# Patient Record
Sex: Male | Born: 1960 | Race: White | Hispanic: No | Marital: Married | Smoking: Former smoker
Health system: Southern US, Community
[De-identification: ages and names within clinical notes are randomized; demographics above are authoritative.]

## PROBLEM LIST (undated history)

## (undated) DIAGNOSIS — D126 Benign neoplasm of colon, unspecified: Secondary | ICD-10-CM

## (undated) DIAGNOSIS — D509 Iron deficiency anemia, unspecified: Secondary | ICD-10-CM

## (undated) DIAGNOSIS — M1990 Unspecified osteoarthritis, unspecified site: Secondary | ICD-10-CM

## (undated) DIAGNOSIS — R7611 Nonspecific reaction to tuberculin skin test without active tuberculosis: Secondary | ICD-10-CM

## (undated) HISTORY — DX: Benign neoplasm of colon, unspecified: D12.6

## (undated) HISTORY — PX: OTHER SURGICAL HISTORY: SHX169

## (undated) HISTORY — PX: COLONOSCOPY: SHX174

## (undated) HISTORY — DX: Nonspecific reaction to tuberculin skin test without active tuberculosis: R76.11

## (undated) HISTORY — DX: Unspecified osteoarthritis, unspecified site: M19.90

## (undated) HISTORY — PX: KNEE ARTHROSCOPY: SUR90

## (undated) HISTORY — DX: Iron deficiency anemia, unspecified: D50.9

---

## 1999-11-11 ENCOUNTER — Encounter: Payer: Self-pay | Admitting: Orthopedic Surgery

## 1999-11-11 ENCOUNTER — Ambulatory Visit (HOSPITAL_COMMUNITY): Admission: RE | Admit: 1999-11-11 | Discharge: 1999-11-11 | Payer: Self-pay | Admitting: Orthopedic Surgery

## 2002-07-05 ENCOUNTER — Emergency Department (HOSPITAL_COMMUNITY): Admission: EM | Admit: 2002-07-05 | Discharge: 2002-07-05 | Payer: Self-pay | Admitting: Emergency Medicine

## 2002-07-05 ENCOUNTER — Encounter: Payer: Self-pay | Admitting: Emergency Medicine

## 2002-07-06 ENCOUNTER — Emergency Department (HOSPITAL_COMMUNITY): Admission: EM | Admit: 2002-07-06 | Discharge: 2002-07-06 | Payer: Self-pay

## 2002-07-07 ENCOUNTER — Emergency Department (HOSPITAL_COMMUNITY): Admission: EM | Admit: 2002-07-07 | Discharge: 2002-07-07 | Payer: Self-pay

## 2002-07-12 ENCOUNTER — Emergency Department (HOSPITAL_COMMUNITY): Admission: EM | Admit: 2002-07-12 | Discharge: 2002-07-12 | Payer: Self-pay | Admitting: Emergency Medicine

## 2002-07-19 ENCOUNTER — Emergency Department (HOSPITAL_COMMUNITY): Admission: EM | Admit: 2002-07-19 | Discharge: 2002-07-19 | Payer: Self-pay | Admitting: *Deleted

## 2005-02-11 ENCOUNTER — Encounter (INDEPENDENT_AMBULATORY_CARE_PROVIDER_SITE_OTHER): Payer: Self-pay | Admitting: *Deleted

## 2005-02-11 ENCOUNTER — Ambulatory Visit (HOSPITAL_COMMUNITY): Admission: RE | Admit: 2005-02-11 | Discharge: 2005-02-11 | Payer: Self-pay | Admitting: Gastroenterology

## 2005-10-23 ENCOUNTER — Encounter (HOSPITAL_COMMUNITY): Admission: RE | Admit: 2005-10-23 | Discharge: 2006-01-21 | Payer: Self-pay | Admitting: Gastroenterology

## 2006-02-05 ENCOUNTER — Encounter (HOSPITAL_COMMUNITY): Admission: RE | Admit: 2006-02-05 | Discharge: 2006-05-06 | Payer: Self-pay | Admitting: Gastroenterology

## 2006-08-28 ENCOUNTER — Inpatient Hospital Stay (HOSPITAL_COMMUNITY): Admission: AD | Admit: 2006-08-28 | Discharge: 2006-09-03 | Payer: Self-pay | Admitting: Gastroenterology

## 2006-08-28 ENCOUNTER — Encounter (INDEPENDENT_AMBULATORY_CARE_PROVIDER_SITE_OTHER): Payer: Self-pay | Admitting: *Deleted

## 2007-04-20 ENCOUNTER — Encounter: Payer: Self-pay | Admitting: Internal Medicine

## 2007-05-03 ENCOUNTER — Inpatient Hospital Stay (HOSPITAL_COMMUNITY): Admission: EM | Admit: 2007-05-03 | Discharge: 2007-05-04 | Payer: Self-pay | Admitting: Emergency Medicine

## 2007-05-31 ENCOUNTER — Encounter: Payer: Self-pay | Admitting: Internal Medicine

## 2007-08-25 ENCOUNTER — Encounter: Payer: Self-pay | Admitting: Internal Medicine

## 2007-11-09 ENCOUNTER — Encounter: Payer: Self-pay | Admitting: Internal Medicine

## 2008-03-07 ENCOUNTER — Encounter: Payer: Self-pay | Admitting: Internal Medicine

## 2008-04-12 ENCOUNTER — Encounter: Payer: Self-pay | Admitting: Internal Medicine

## 2008-04-18 ENCOUNTER — Encounter: Payer: Self-pay | Admitting: Internal Medicine

## 2008-04-27 ENCOUNTER — Encounter: Admission: RE | Admit: 2008-04-27 | Discharge: 2008-04-27 | Payer: Self-pay | Admitting: Gastroenterology

## 2008-04-27 ENCOUNTER — Encounter: Payer: Self-pay | Admitting: Internal Medicine

## 2008-05-12 ENCOUNTER — Encounter: Payer: Self-pay | Admitting: Internal Medicine

## 2008-05-19 ENCOUNTER — Encounter: Payer: Self-pay | Admitting: Internal Medicine

## 2008-06-14 ENCOUNTER — Encounter: Payer: Self-pay | Admitting: Internal Medicine

## 2008-07-13 ENCOUNTER — Encounter: Payer: Self-pay | Admitting: Internal Medicine

## 2008-09-13 ENCOUNTER — Encounter: Payer: Self-pay | Admitting: Internal Medicine

## 2008-11-24 ENCOUNTER — Encounter: Payer: Self-pay | Admitting: Internal Medicine

## 2009-01-22 ENCOUNTER — Encounter: Payer: Self-pay | Admitting: Internal Medicine

## 2009-02-06 ENCOUNTER — Encounter: Payer: Self-pay | Admitting: Internal Medicine

## 2009-04-25 ENCOUNTER — Encounter: Payer: Self-pay | Admitting: Internal Medicine

## 2009-09-17 ENCOUNTER — Encounter: Payer: Self-pay | Admitting: Internal Medicine

## 2010-01-03 ENCOUNTER — Encounter: Payer: Self-pay | Admitting: Internal Medicine

## 2010-03-13 ENCOUNTER — Telehealth: Payer: Self-pay | Admitting: Internal Medicine

## 2010-04-03 ENCOUNTER — Encounter (INDEPENDENT_AMBULATORY_CARE_PROVIDER_SITE_OTHER): Payer: Self-pay | Admitting: *Deleted

## 2010-04-03 ENCOUNTER — Telehealth: Payer: Self-pay | Admitting: Internal Medicine

## 2010-04-11 DIAGNOSIS — D509 Iron deficiency anemia, unspecified: Secondary | ICD-10-CM | POA: Insufficient documentation

## 2010-04-11 DIAGNOSIS — K5190 Ulcerative colitis, unspecified, without complications: Secondary | ICD-10-CM | POA: Insufficient documentation

## 2010-04-18 ENCOUNTER — Ambulatory Visit: Payer: Self-pay | Admitting: Internal Medicine

## 2010-04-19 ENCOUNTER — Telehealth: Payer: Self-pay | Admitting: Internal Medicine

## 2010-04-19 LAB — CONVERTED CEMR LAB
Alkaline Phosphatase: 75 units/L (ref 39–117)
BUN: 15 mg/dL (ref 6–23)
Basophils Absolute: 0 10*3/uL (ref 0.0–0.1)
Calcium: 8.9 mg/dL (ref 8.4–10.5)
Chloride: 103 meq/L (ref 96–112)
Eosinophils Absolute: 0 10*3/uL (ref 0.0–0.7)
GFR calc non Af Amer: 102.73 mL/min (ref >60)
Lymphocytes Relative: 5.4 % — ABNORMAL LOW (ref 12.0–46.0)
MCHC: 32.2 g/dL (ref 30.0–36.0)
Neutro Abs: 12.9 10*3/uL — ABNORMAL HIGH (ref 1.4–7.7)
Neutrophils Relative %: 89.4 % — ABNORMAL HIGH (ref 43.0–77.0)
Platelets: 586 10*3/uL — ABNORMAL HIGH (ref 150.0–400.0)
RDW: 17.4 % — ABNORMAL HIGH (ref 11.5–14.6)
Saturation Ratios: 5.4 % — ABNORMAL LOW (ref 20.0–50.0)
Sodium: 140 meq/L (ref 135–145)
Total Protein: 7 g/dL (ref 6.0–8.3)
Transferrin: 264.5 mg/dL (ref 212.0–360.0)

## 2010-04-23 ENCOUNTER — Encounter: Payer: Self-pay | Admitting: Internal Medicine

## 2010-05-15 ENCOUNTER — Telehealth: Payer: Self-pay | Admitting: Internal Medicine

## 2010-06-21 ENCOUNTER — Encounter: Payer: Self-pay | Admitting: Internal Medicine

## 2010-07-18 ENCOUNTER — Encounter: Payer: Self-pay | Admitting: Internal Medicine

## 2010-07-19 ENCOUNTER — Telehealth: Payer: Self-pay | Admitting: Internal Medicine

## 2010-08-02 ENCOUNTER — Ambulatory Visit: Payer: Self-pay | Admitting: Internal Medicine

## 2010-08-05 LAB — CONVERTED CEMR LAB
Basophils Relative: 0.3 % (ref 0.0–3.0)
Hemoglobin: 12.5 g/dL — ABNORMAL LOW (ref 13.0–17.0)
Lymphocytes Relative: 5.4 % — ABNORMAL LOW (ref 12.0–46.0)
MCHC: 32.5 g/dL (ref 30.0–36.0)
Monocytes Relative: 8.3 % (ref 3.0–12.0)
Neutro Abs: 12.9 10*3/uL — ABNORMAL HIGH (ref 1.4–7.7)
RBC: 4.64 M/uL (ref 4.22–5.81)

## 2010-08-15 ENCOUNTER — Telehealth: Payer: Self-pay | Admitting: Internal Medicine

## 2010-08-27 ENCOUNTER — Ambulatory Visit: Payer: Self-pay | Admitting: Internal Medicine

## 2010-08-28 ENCOUNTER — Encounter (INDEPENDENT_AMBULATORY_CARE_PROVIDER_SITE_OTHER): Payer: Self-pay | Admitting: *Deleted

## 2010-09-03 ENCOUNTER — Telehealth: Payer: Self-pay | Admitting: Internal Medicine

## 2010-10-11 ENCOUNTER — Telehealth: Payer: Self-pay | Admitting: Internal Medicine

## 2010-10-12 ENCOUNTER — Encounter: Payer: Self-pay | Admitting: Internal Medicine

## 2010-10-14 ENCOUNTER — Encounter: Payer: Self-pay | Admitting: Internal Medicine

## 2010-10-17 ENCOUNTER — Telehealth: Payer: Self-pay | Admitting: Internal Medicine

## 2010-10-17 ENCOUNTER — Ambulatory Visit: Payer: Self-pay | Admitting: Internal Medicine

## 2010-11-13 ENCOUNTER — Telehealth: Payer: Self-pay | Admitting: Internal Medicine

## 2010-11-20 ENCOUNTER — Encounter: Payer: Self-pay | Admitting: Internal Medicine

## 2010-11-29 ENCOUNTER — Telehealth: Payer: Self-pay | Admitting: Internal Medicine

## 2011-01-16 NOTE — Letter (Signed)
Summary: [facility]   Imported By: Bubba Hales 04/16/2010 12:08:38  _____________________________________________________________________  External Attachment:    Type:   Image     Comment:   External Document

## 2011-01-16 NOTE — Progress Notes (Signed)
Summary: Medication  Phone Note Call from Patient Call back at [phone]   Caller: Patient Call For: Dr. Olevia Perches Reason for Call: Talk to Nurse Summary of Call: Pt wants to disucss the appt he needs to make for a colon, he also has questions about his [name] Initial call taken by: Martinique Johnson,  November 13, 2010 9:42 AM  Follow-up for Phone Call        I have left a message for the patient to call back. Dottie Nelson-Smith CMA Deborra Medina)  November 13, 2010 3:17 PM   Spoke with patient. Apparently there has been a mixup regarding [name] prescription and he has no refills. I have called and spoken to Raquel Sarna at [facility] and they will send out prescription tommorrow with priority shipping. Per Dr Nichola Sizer last note, patient was to schedule colonoscopy before the end of the year. Per patient, his insurance deductible started over in November. He is waiting to hear back about some insurance conflicts before he schedules a colonoscopy. He states that Dr Olevia Perches already knows about this. Follow-up by: Madlyn Frankel CMA Deborra Medina),  November 13, 2010 4:39 PM    New/Updated Medications: [name] 40 MG/0.8ML KIT (ADALIMUMAB) Inject 61m Subcutaneously weekly. [name] 40 MG/0.8ML KIT (ADALIMUMAB) Inject 49mSubcutaneously weekly. Prescriptions: [name] 40 MG/0.8ML KIT (ADALIMUMAB) Inject 4033mubcutaneously weekly.  #4 pens x 11   Entered by:   DotMadlyn FrankelA (AAMA)   Authorized by:   DorLafayette Dragon   Signed by:   DotJasperAMCoalmontn 11/13/2010   Method used:   Electronically to        [facility]       [address]     [place]  [id number]    Ph: [phone]    Fax: [phone]:  :   [id number] Document: Medication last colonoscopy 04/2009 by Dr BucCristina Gonge can wait till May 2012 ( but not later ) to have his colonoscopy. he has been having colonoscopies every 2-3 years.

## 2011-01-16 NOTE — Procedures (Signed)
Summary: COLON (Dr Kalman Shan)   EGD  Procedure date:  02/11/2005  Findings:      Location: [facility]   NAME:  [name], [name]              ACCOUNT NO.:  [id number]   MEDICAL RECORD NO.:  [id number]          PATIENT TYPE:  AMB   LOCATION:  ENDO                         FACILITY:  [facility]   PHYSICIAN:  Ronald Lobo, M.D.   DATE OF BIRTH:  05-Jul-1961   DATE OF PROCEDURE:  02/11/2005  DATE OF DISCHARGE:                                 OPERATIVE REPORT   PROCEDURE:  Colonoscopy with biopsy.   INDICATION:  A 50 year old gentleman with a longstanding history of  ulcerative colitis, with recent exacerbation requiring high doses of  prednisone to try to control symptoms.   FINDINGS:  Severe colitis extending almost to the cecum.   PROCEDURE:  The nature, purpose, and risks of the procedure had been  discussed with the patient who provided written consent. Sedation was  fentanyl 50 mcg and Versed 5 milligrams plus Phenergan 12.5 milligrams IV  without arrhythmias or desaturation. The patient had prolapsed internal  hemorrhoids without obvious thrombosis but they were succulent and engorged.  I did not get a good feel of the prostate gland due to rectal discomfort on  examination related to the hemorrhoids.   The Olympus adjustable tension pediatric video colonoscope was easily  advanced around the colon to the cecum as identified by clear visualization  of the appendiceal orifice. I attempted to enter the terminal ileum but was  not able to do so, although there was no evidence of any ulceration, frank  stenosis or anatomic abnormalities in the cecum.   This procedure was done unprepped but, despite that, there was essentially  no stool throughout the colon, just a little bit of mucoid and slightly  solid stool residue in the cecum.   This exam was remarkable for the presence of confluent pancolitis up to the  level of the cecum or proximal ascending colon. That colitis  was  characterized by complete loss of vascularity, intense erythema,  granularity, and areas of exudate and pinpoint hemorrhages. There was a  fairly sharp transition zone just above the cecum so that the proximal 5-10  cm of the colon had normal appearing mucosa and biopsies were obtained from  that area separately as well as randomly from around the colon.  Overall, I  would rate the intensity of the colitis as approximately seven or eight  scale of 10.   No polyps, masses or diverticulosis were noted. I did not retroflex in the  rectum.   The patient tolerated the procedure well and there no apparent  complications.   IMPRESSION:  Active, severe nearly pancolitis consistent with his underlying  diagnosis of ulcerative colitis.   PLAN:  Await pathology results. Consider intensification medical therapy.      RB/MEDQ  D:  02/11/2005  T:  02/11/2005  Job:  [id number]    FINAL DIAGNOSIS    ***MICROSCOPIC EXAMINATION AND DIAGNOSIS***    1. COLON, CECUM, BIOPSY: NO ACTIVE COLITIS OR DYSPLASIA   IDENTIFIED.    2. COLON, RANDOM  BIOPSIES: SEVERE, ACTIVE MUCOSAL COLITIS WITH   FEATURES OF INFLAMMATORY BOWEL DISEASE, FAVOR ULCERATIVE COLITIS,   SEE COMMENT.    COMMENT   1. There is colorectal mucosa with normal crypt architecture and   no objective increase in inflammation. No active inflammation,   microscopic colitis, collagenous colitis or significant chronic   changes identified. No hyperplastic or adenomatous changes are   seen, and there is no evidence of malignancy.    2. There are fragments of benign colonic mucosa in which there   is severe inflammation within the lamina propria associated with   cryptitis, crypt abscesses, architectural distortion, and basal   plasmacytosis. The surface of the mucosa is partially eroded.   No granulomatous inflammation is seen. Some of the glands show   goblet cell dropout and florid benign reactive/reparative   changes. There is no  evidence of malignancy. In one fragment   there is inflammation that appears to extend into a small amount   of submucosa in the biopsy. All the fragments in this specimen   are involved with the colitis.    In Part 2 the findings are those of a severe chronic active   mucosal colitis that displays features of an inflammatory bowel   disease. Although there is focal inflammation in a small amount   of submucosa present, all the tissue fragments in Part 2 are   involved with the process and this would favor ulcerative   colitis. Therefore, clinical and endoscopic correlation is   strongly recommended. (JAS:caf 02/12/05)    cf   Date Reported: 02/12/2005 Neldon Mc, MD   *** Electronically Signed Out By JAS ***    Clinical information   Colitis (cm)    specimen(s) obtained   1: Colon, biopsy, cecal   2: Colon, biopsy, random    Gross Description   1. Received in formalin are tan, soft tissue fragments that are   submitted in toto. Number: Multiple pieces   Size: 0.2 to 0.4 cm    2. Received in formalin are tan, soft tissue fragments that are   submitted in toto. Number: Multiple pieces   Size: 0.2 to 0.3 cm (GRP:glk 02-11-05)    gk/

## 2011-01-16 NOTE — Letter (Signed)
Summary: [facility]   Imported By: Bubba Hales 04/16/2010 12:41:24  _____________________________________________________________________  External Attachment:    Type:   Image     Comment:   External Document

## 2011-01-16 NOTE — Letter (Signed)
Summary: [facility]  [facility]   Imported By: Bubba Hales 04/16/2010 12:06:49  _____________________________________________________________________  External Attachment:    Type:   Image     Comment:   External Document

## 2011-01-16 NOTE — Miscellaneous (Signed)
  Clinical Lists Changes  Medications: Rx of DICYCLOMINE HCL 20 MG TABS (DICYCLOMINE HCL) Take one by mouth twice daily as needed.;  #60 x 0;  Signed;  Entered by: Madlyn Frankel CMA (AAMA);  Authorized by: Lafayette Dragon MD;  Method used: Electronically to [facility]. #[id number]*, [address] [place], [place], [place]  [address], Ph: [phone], Fax: [phone]    Prescriptions: DICYCLOMINE HCL 20 MG TABS (DICYCLOMINE HCL) Take one by mouth twice daily as needed.  #60 x 0   Entered by:   Madlyn Frankel CMA (AAMA)   Authorized by:   Lafayette Dragon MD   Signed by:   [place] ([place]) on 07/18/2010   Method used:   Electronically to        [facility]. [phone]* (retail)       [address]       [place], [place]  [address]       Ph: [phone]       Fax: [phone]   RxID:   [id number]

## 2011-01-16 NOTE — Letter (Signed)
Summary: [facility]  [facility]   Imported By: Bubba Hales 04/16/2010 11:58:06  _____________________________________________________________________  External Attachment:    Type:   Image     Comment:   External Document

## 2011-01-16 NOTE — Progress Notes (Signed)
Summary: Follow Up Needed??  ---- Converted from flag ----  ---- 07/18/2010 9:39 AM, Carla Drape Nelson-Smith CMA (AAMA) wrote: I have spoken to patient to advise him that he needs an office visit however, he says that he did not know he needed a visit...Marland KitchenMarland KitchenIn your May office note, you wanted him to have an 8 week follow up....Marland KitchenMarland KitchenHe says that you and he spoke and he asked if he needed an office visit but was told he did not....Marland KitchenMarland KitchenI have gone ahead and scheduled him for a follow up appointment 08-02-10 and told him I would check with you to make certain he needed to keep the appointment as I was unaware he had a conversation with you about this previously. He has also been given a refill on methotrexate until I get your reply. He states that he is doing well, no rectal bleeding and is down to 5 mg prednisone. ------------------------------  ---- 07/18/2010 10:18 PM, Lafayette Dragon MD wrote: He should follow up just to see where he stands with his steroid taper.  Phone Note Outgoing Call   Call placed by: Madlyn Frankel CMA Deborra Medina),  July 19, 2010 8:29 AM Call placed to: Patient Summary of Call: I have advised patient that he will need to keep his follow up appointment for 08/02/10. Patient verbalizes understanding. Initial call taken by: Madlyn Frankel CMA (AAMA),  July 19, 2010 8:30 AM

## 2011-01-16 NOTE — Letter (Signed)
Summary: [facility]  [facility]   Imported By: Bubba Hales 04/16/2010 12:42:57  _____________________________________________________________________  External Attachment:    Type:   Image     Comment:   External Document

## 2011-01-16 NOTE — Letter (Signed)
Summary: [facility]  [facility]   Imported By: Bubba Hales 04/16/2010 11:55:13  _____________________________________________________________________  External Attachment:    Type:   Image     Comment:   External Document

## 2011-01-16 NOTE — Letter (Signed)
Summary: [facility]  [facility]   Imported By: Bubba Hales 04/16/2010 12:03:22  _____________________________________________________________________  External Attachment:    Type:   Image     Comment:   External Document

## 2011-01-16 NOTE — Assessment & Plan Note (Signed)
Summary: 3 month f/u colitis/dns   History of Present Illness Visit Type: Follow-up Visit Primary GI MD: [name] MD Primary Provider: Tami Lin, MD Chief Complaint: ulcerative colitis- no flare ups History of Present Illness:   This is a 50 year old white male with ulcerative colitis since 1995 who has been followed by me since May 2011. He has  been in a a chronic flareup of pancolitis. He is finally going into remission having 4-5 bowel movements a day, usually one or 2 at night. He occasionally has a small amount of blood per rectum. His medications include methotrexate 15 mg weekly, [name] 40 mg weekly and a prednisone taper down to 3 mg daily going down by 1 mg every month. He takes Bentyl 20 mg twice a day. He was on iron supplements. He is due for a colonoscopy at this time. He had positive IBD markers. He has no specific complaints today and his level of energy has been good.   GI Review of Systems      Denies abdominal pain, acid reflux, belching, bloating, chest pain, dysphagia with liquids, dysphagia with solids, heartburn, loss of appetite, nausea, vomiting, vomiting blood, weight loss, and  weight gain.        Denies anal fissure, black tarry stools, change in bowel habit, constipation, diarrhea, diverticulosis, fecal incontinence, heme positive stool, hemorrhoids, irritable bowel syndrome, jaundice, light color stool, liver problems, rectal bleeding, and  rectal pain.    Current Medications (verified): 1)  Prednisone 1 Mg Tabs (Prednisone) .... Take As Directed 2)  [name] 40 Mg/0.59m Kit (Adalimumab) .... 461mSubcutaneously Weekly. 3)  Methotrexate 2.5 Mg Tabs (Methotrexate Sodium) .... Take 6 Tablets By Mouth Every Friday 4)  Entocort Ec 3 Mg Xr24h-Cap (Budesonide) .... Take 3 Capsules By Mouth Every Morning 5)  Ferrous Sulfate 325 (65 Fe) Mg Tabs (Ferrous Sulfate) .... Take One By Mouth 3 Times Daily, With Meals. 6)  Dicyclomine Hcl 20 Mg Tabs (Dicyclomine Hcl)  .... Take One By Mouth Twice Daily As Needed.  Allergies (verified): No Known Drug Allergies  Past History:  Past Medical History: Reviewed history from 04/18/2010 and no changes required. Current Problems:  ANEMIA, IRON DEFICIENCY (ICD-280.9) ULCERATIVE COLITIS (ICD-556.9)   Arthritis  Past Surgical History: Reviewed history from 04/18/2010 and no changes required. shoulder surgery left ankle surgery  Family History: Reviewed history from 04/11/2010 and no changes required. Family History of Colitis: Mother No FH of Colon Cancer:  Social History: Reviewed history from 04/11/2010 and no changes required. Patient is a former smoker. -stopped in 197121llicit Drug Use - no--did in the remote past Occupation: self employed Alcohol Use - no  Review of Systems  The patient denies allergy/sinus, anemia, anxiety-new, arthritis/joint pain, back pain, blood in urine, breast changes/lumps, change in vision, confusion, cough, coughing up blood, depression-new, fainting, fatigue, fever, headaches-new, hearing problems, heart murmur, heart rhythm changes, itching, menstrual pain, muscle pains/cramps, night sweats, nosebleeds, pregnancy symptoms, shortness of breath, skin rash, sleeping problems, sore throat, swelling of feet/legs, swollen lymph glands, thirst - excessive , urination - excessive , urination changes/pain, urine leakage, vision changes, and voice change.         Pertinent positive and negative review of systems were noted in the above HPI. All other ROS was otherwise negative.   Vital Signs:  Patient profile:   [age]ear old male Height:      68 inches Weight:      174 pounds BMI:     26.55 Pulse  rate:   64 / minute Pulse rhythm:   regular BP sitting:   100 / 68  (left arm) Cuff size:   regular  Vitals Entered By: June McMurray Kimble Deborra Medina) (October 17, 2010 11:00 AM)  Physical Exam  General:  Well developed, well nourished, no acute distress. Eyes:  PERRLA, no  icterus. Mouth:  No deformity or lesions, dentition normal. Neck:  Supple; no masses or thyromegaly. Lungs:  Clear throughout to auscultation. Heart:  Regular rate and rhythm; no murmurs, rubs,  or bruits. Abdomen:  soft nontender abdomen with normoactive bowel sounds. No palpable mass. Extremities:  multiple ecchymoses. Skin:  Intact without significant lesions or rashes. Psych:  Alert and cooperative. Normal mood and affect.   Impression & Recommendations:  Problem # 1:  ULCERATIVE COLITIS (ICD-556.9) Patient has ulcerative pancolitis in partial remission. He is due for a colonoscopy to rule out dysplasia. He needs to continue on his prednisone taper, methotrexate and [name]. We will give him refills on all medications at the appropriate time.   Problem # 2:  ANEMIA, IRON DEFICIENCY (ICD-280.9) Patient needs to continue iron supplements.  Patient Instructions: 1)  Take methotrexate 2.5 mg, take 6 tablets weekly. 2)  Continue [name] 40 mg IM weekly. 3)  Prednisone 30 mg daily x 3 more weeks then 2 mg daily.  4)  You should have a CBC next time you come to the office. 5)  Continue iron supplements. 6)  Dicyclomine 20 mg twice a day. 7)  You will need to have a colonscopy scheduled before the end of the year. Please call us back to schedule this. 8)  Copy sent to :Dr Laney Pastor  9)  The medication list was reviewed and reconciled.  All changed / newly prescribed medications were explained.  A complete medication list was provided to the patient / caregiver.

## 2011-01-16 NOTE — Letter (Signed)
Summary: New Patient letter  [facility]  [address] [place], [place] [address]   Phone: [phone]  Fax: [phone]       04/03/2010 MRN: [id number]  [facility] [place], [place]  [address]  Dear Mr. Knueppel,  Welcome to the Gastroenterology Division at [facility].    You are scheduled to see Dr. [name] on 04-18-10 at 11:30am, on the 3rd floor at [facility] [place] [facility].  We ask that you try to arrive at our office 15 minutes prior to your appointment time to allow for check-in.  We would like you to complete the enclosed self-administered evaluation form prior to your visit and bring it with you on the day of your appointment.  We will review it with you.  Also, please bring a complete list of all your medications or, if you prefer, bring the medication bottles and we will list them.  Please bring your insurance card so that we may make a copy of it.  If your insurance requires a referral to see a specialist, please bring your referral form from your primary care physician.  Co-payments are due at the time of your visit and may be paid by cash, check or credit card.     Your office visit will consist of a consult with your physician (includes a physical exam), any laboratory testing he/she may order, scheduling of any necessary diagnostic testing (e.g. x-ray, ultrasound, CT-scan), and scheduling of a procedure (e.g. Endoscopy, Colonoscopy) if required.  Please allow enough time on your schedule to allow for any/all of these possibilities.    If you cannot keep your appointment, please call [phone] to cancel or reschedule prior to your appointment date.  This allows [place] the opportunity to schedule an appointment for another patient in need of care.  If you do not cancel or reschedule by 5 p.m. the business day prior to your appointment date, you will be charged a $50.00 late cancellation/no-show fee.    Thank you for choosing  [facility] for your medical needs.  We appreciate the opportunity to care for you.  Please visit [place] at our website  to learn more about our practice.                     Sincerely,                                                             The Gastroenterology Division   Appended Document: New Patient letter Letter mailed to patient.

## 2011-01-16 NOTE — Medication Information (Signed)
Summary: Approval for [name]/[employer]  Approval for [name]/[employer]   Imported By: Edmonia James 10/22/2010 12:52:30  _____________________________________________________________________  External Attachment:    Type:   Image     Comment:   External Document

## 2011-01-16 NOTE — Letter (Signed)
Summary: [facility]  [facility]   Imported By: Bubba Hales 04/16/2010 12:04:44  _____________________________________________________________________  External Attachment:    Type:   Image     Comment:   External Document

## 2011-01-16 NOTE — Miscellaneous (Signed)
Summary: Methotrexate  Clinical Lists Changes  Medications: Changed medication from METHOTREXATE 2.5 MG TABS (METHOTREXATE SODIUM) Take 6 tablets by mouth every friday to METHOTREXATE 2.5 MG TABS (METHOTREXATE SODIUM) Take 6 tablets by mouth every friday (PHARMACY-PLEASE WRITE ON SCRIPT.....MUST HAVE OFFICE VISIT FOR FURTHER REFILLS!) - Signed Rx of METHOTREXATE 2.5 MG TABS (METHOTREXATE SODIUM) Take 6 tablets by mouth every friday (PHARMACY-PLEASE WRITE ON SCRIPT.....MUST HAVE OFFICE VISIT FOR FURTHER REFILLS!);  #24 x 0;  Signed;  Entered by: Madlyn Frankel CMA (AAMA);  Authorized by: Lafayette Dragon MD;  Method used: Electronically to [facility]. #[id number]*, [address] [place], [place], [place]  [address], Ph: [phone], Fax: [phone]    Prescriptions: METHOTREXATE 2.5 MG TABS (METHOTREXATE SODIUM) Take 6 tablets by mouth every friday (PHARMACY-PLEASE WRITE ON SCRIPT.....MUST HAVE OFFICE VISIT FOR FURTHER REFILLS!)  #24 x 0   Entered by:   Madlyn Frankel CMA (AAMA)   Authorized by:   Lafayette Dragon MD   Signed by:   [place] ([place]) on 06/21/2010   Method used:   Electronically to        [facility]. [phone]* (retail)       [address] [place] [address]       [place], [place]  [address]       Ph: [phone]       Fax: [phone]   RxID:   [id number]

## 2011-01-16 NOTE — Letter (Signed)
Summary: [facility]  [facility]   Imported By: Bubba Hales 04/16/2010 12:01:03  _____________________________________________________________________  External Attachment:    Type:   Image     Comment:   External Document

## 2011-01-16 NOTE — Letter (Signed)
Summary: [facility]  [facility]   Imported By: Bubba Hales 04/16/2010 12:10:23  _____________________________________________________________________  External Attachment:    Type:   Image     Comment:   External Document

## 2011-01-16 NOTE — Letter (Signed)
Summary: [facility]  [facility]   Imported By: Bubba Hales 04/16/2010 12:02:16  _____________________________________________________________________  External Attachment:    Type:   Image     Comment:   External Document

## 2011-01-16 NOTE — Progress Notes (Signed)
Summary: discussion  Phone Note Call from Patient Call back at [phone]   Caller: Patient Call For: Dr. Olevia Perches Reason for Call: Talk to Nurse Summary of Call: would like to speak with Dottie about "medication" and his "health"... pt would not give any further detail Initial call taken by: Lucien Mons,  September 03, 2010 2:35 PM  Follow-up for Phone Call        I have left a message for the patient to call back.Dottie Nelson-Smith CMA Deborra Medina)  September 03, 2010 3:33 PM   Patient requests his prescriptions be sent in today. I have advised him that this was completed already and I have called the pharmacy to make certain that the medication is available to him. Dottie Nelson-Smith CMA Deborra Medina)  September 03, 2010 4:01 PM

## 2011-01-16 NOTE — Progress Notes (Signed)
Summary: Colon Questions  Phone Note Call from Patient Call back at [phone]   Caller: Patient Call For: Dr. Santiago Bumpers Reason for Call: Talk to Nurse Summary of Call: Made Pt aware of notes in EMR but he still have concerns that he only wants to discuss with Dottie because that is who he has been speak with  Initial call taken by: Martinique Johnson,  November 29, 2010 10:35 AM  Follow-up for Phone Call        lm for patient to call back. Dottie Nelson-Smith CMA Deborra Medina)  November 29, 2010 12:48 PM   I have left a message for the patient to call back. Dottie Nelson-Smith CMA Deborra Medina)  December 02, 2010 2:03 PM   Patient would like [place] to know that he will be having procedure (colon) in March 2012 weather and money permitting. Advised him that this is fine as long as he has procedure by May 2012 per Dr Olevia Perches. Dottie Nelson-Smith CMA Deborra Medina)  December 02, 2010 3:08 PM

## 2011-01-16 NOTE — Assessment & Plan Note (Signed)
Summary: f/u crohns/dn   History of Present Illness Visit Type: Follow-up Visit Primary GI MD: [name] MD Primary Kalasia Crafton: Tami Lin, MD Chief Complaint: Patient here to follow up for his crohns he states that he is doing great and having no problems.  History of Present Illness:   This is a 50 year old white male with ulcerative colitis since 1995. His first visit with [place] was on 04/18/10. His last colonoscopy in May 2006 showed pancolitis. He has had chronic diarrhea which has improved greatly on Bentyl 20 mg p.r.n. His last hemoglobin was 1.3, hematocrit was 35.1 with an albumin of 3.4 and a sedimentation rate of 40. His iron saturation was 5.4%. Overall, he is doing reasonably well using probiotics, [name] 40 mg weekly, Entocort 9 mg daily, methotrexate 15 mg weekly and a prednisone taper. He is currently at 5 mg a day. 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   GI Review of Systems      Denies abdominal pain, acid reflux, belching, bloating, chest pain, dysphagia with liquids, dysphagia with solids, heartburn, loss of appetite, nausea, vomiting, vomiting blood, weight loss, and  weight gain.        Denies anal fissure, black tarry stools, change in bowel habit, constipation, diarrhea, diverticulosis, fecal incontinence, heme positive stool, hemorrhoids, irritable bowel syndrome, jaundice, light color stool, liver problems, rectal bleeding, and  rectal pain.    Current Medications (verified): 1)  Prednisone 5 Mg Tabs (Prednisone) .... Take 1 Tablet Daily 2)  [name] 40 Mg/0.71m Kit (Adalimumab) .... 448mSubcutaneously Weekly. 3)  Methotrexate 2.5 Mg Tabs (Methotrexate Sodium) .... Take 6 Tablets By Mouth Every Friday 4)  Entocort Ec 3 Mg Xr24h-Cap (Budesonide) .... Take 3 Capsules By Mouth Every Morning 5)  Ferrous Sulfate 325 (65 Fe) Mg Tabs (Ferrous Sulfate) .... Take One By Mouth 3 Times Daily, With Meals. 6)  Dicyclomine Hcl 20 Mg Tabs  (Dicyclomine Hcl) .... Take One By Mouth Twice Daily As Needed.  Allergies (verified): No Known Drug Allergies  Past History:  Past Medical History: Reviewed history from 04/18/2010 and no changes required. Current Problems:  ANEMIA, IRON DEFICIENCY (ICD-280.9) ULCERATIVE COLITIS (ICD-556.9)   Arthritis  Past Surgical History: Reviewed history from 04/18/2010 and no changes required. shoulder surgery left ankle surgery  Family History: Reviewed history from 04/11/2010 and no changes required. Family History of Colitis: Mother No FH of Colon Cancer:  Social History: Reviewed history from 04/11/2010 and no changes required. Patient is a former smoker. -stopped in 198916llicit Drug Use - no--did in the remote past Occupation: self employed Alcohol Use - no  Review of Systems  The patient denies allergy/sinus, anemia, anxiety-new, arthritis/joint pain, back pain, blood in urine, breast changes/lumps, change in vision, confusion, cough, coughing up blood, depression-new, fainting, fatigue, fever, headaches-new, hearing problems, heart murmur, heart rhythm changes, itching, menstrual pain, muscle pains/cramps, night sweats, nosebleeds, pregnancy symptoms, shortness of breath, skin rash, sleeping problems, sore throat, swelling of feet/legs, swollen lymph glands, thirst - excessive , urination - excessive , urination changes/pain, urine leakage, vision changes, and voice change.         Pertinent positive and negative review of systems were noted in the above HPI. All other ROS was otherwise negative.   Vital Signs:  Patient profile:   [age]ear old male Height:      68 inches Weight:      172.8 pounds BMI:     26.37 Pulse rate:   76 /  minute Pulse rhythm:   regular BP sitting:   112 / 66  (left arm) Cuff size:   regular  Vitals Entered By: Bernita Buffy CMA Deborra Medina) (August 02, 2010 1:57 PM)  Physical Exam  General:  Alert, oriented, somewhat anxious. Eyes:   Conjunctivitis, especially in the right eye. Mouth:  normal mucosa. Neck:  Supple; no masses or thyromegaly. Lungs:  Clear throughout to auscultation. Heart:  Regular rate and rhythm; no murmurs, rubs,  or bruits. Abdomen:  soft nontender abdomen with normoactive bowel sounds. No tenderness. Rectal:  patient refused. Extremities:  trace edema. Skin:  multiple scratch marks or ecchymoses. Psych:  Alert and cooperative. Normal mood and affect.   Impression & Recommendations:  Problem # 1:  ULCERATIVE COLITIS (ICD-556.9) Patient has stable ulcerative colitis which has symptomatically improved using antispasmodics. He has been able to taper his prednisone down to 5 mg daily. He will continue by 1 mg monthly. I will see him again in 3 months. He is to continue on his Entocort, methotrexate, [name], and prednisone taper. We will check a CBC today. Orders: TLB-CBC Platelet - w/Differential (85025-CBCD)  Problem # 2:  ANEMIA, IRON DEFICIENCY (ICD-280.9) Patient has continued on iron supplements. We will check a CBC today. Orders: TLB-CBC Platelet - w/Differential (85025-CBCD)  Patient Instructions: 1)  Continue taking Entocort 9 mg daily. A new electronic prescription has been sent to the pharmacy for you. 2)  Please go to the lab to have your CBC done before leaving the office today. 3)  Please schedule a follow-up appointment in 3 months. 4)  Copy sent to : Dr Tami Lin 5)  The medication list was reviewed and reconciled.  All changed / newly prescribed medications were explained.  A complete medication list was provided to the patient / caregiver. Prescriptions: ENTOCORT EC 3 MG XR24H-CAP (BUDESONIDE) Take 3 capsules by mouth every morning  #90 x 5   Entered by:   Madlyn Frankel CMA (AAMA)   Authorized by:   Lafayette Dragon MD   Signed by:   Alfred (AAMA) on 08/02/2010   Method used:   Electronically to        [facility]. [phone]* (retail)        [place].       [place], [place]  [address]       Ph: [phone]       Fax: [phone]   RxID:   [id number]

## 2011-01-16 NOTE — Progress Notes (Signed)
Summary: Dr.Kaoir Loree will accept pt.  ---- Converted from flag ---- ---- 04/03/2010 2:34 PM, Lafayette Dragon MD wrote: Hilda Blades, I have talked to the pt for a long time. He gave me couple of reasons for transfer which seem valid. I will take him over. Please call him on his cell phone to arrange for an appointment. Thanx  DB ------------------------------  Phone Note Outgoing Call   Call placed by: Vivia Ewing LPN,  April 04, 9995 3:18 PM Call placed to: Patient Summary of Call: Pt. is scheduled to see Dr.Janeth Terry on 04-18-10 at 11:30am, appt. reviewed with him by phone and NP3 packet mailed to him. Pt. instructed to call back as needed.  Initial call taken by: Vivia Ewing LPN,  April 03, 7226 3:20 PM

## 2011-01-16 NOTE — Letter (Signed)
Summary: Office Visit Letter  [facility]  [address] [place], [place] [address]   Phone: [phone]  Fax: [phone]      August 28, 2010 MRN: [id number]   [facility] [address] [place], [place]  [address]   Dear Mr. Fass,   According to our records, it is time for you to schedule a follow-up office visit with [place] in November.   At your convenience, please call [phone] (option #2)to schedule an office visit. If you have any questions, concerns, or feel that this letter is in error, we would appreciate your call.   Sincerely,  Lowella Bandy. Olevia Perches, M.D.  [facility] [phone]

## 2011-01-16 NOTE — Progress Notes (Signed)
Summary: Talk to nurse before appt today at 10:15am  Phone Note Call from Patient Call back at [phone]   Call For: Dr Olevia Perches Reason for Call: Talk to Nurse Summary of Call: Has an appoinment today at 10:15am and wants to discuss a problem with nurse before coming in. Initial call taken by: Irwin Brakeman [facility],  October 17, 2010 8:58 AM  Follow-up for Phone Call        Patient states he is coming for his appointment but it may be 10:30 before he gets here. Wanted to let Dottie know. Follow-up by: Leone Payor RN,  October 17, 2010 9:17 AM

## 2011-01-16 NOTE — Letter (Signed)
[name]   Imported By: Bubba Hales 11/26/2010 09:00:25  _____________________________________________________________________  External Attachment:    Type:   Image     Comment:   External Document

## 2011-01-16 NOTE — Progress Notes (Signed)
Summary: Pt. wants to switch to Dr.Shandrell Boda from [place]  Phone Note Call from Patient Call back at [phone]   Caller: Patient Call For: Dr. Olevia Perches Reason for Call: Talk to Nurse Summary of Call: Pt. would like to see Dr. Olevia Perches b/c his mother is a pt.  Would like to discuss b/c he has Ulcerative Colitis.  Initial call taken by: Webb Laws,  March 13, 2010 9:29 AM  Follow-up for Phone Call        Pt. has  Ulcerative Colitis, currently sees Dr.Buccini. [place].Wants to switch to Dr.Kell Ferris because his mother and sister see her. Pt. advised to have his records sent to [place], I will have Dr.Maegan Buller review and advise if she will accept him as a patient, then I will call pt.  Follow-up by: Vivia Ewing LPN,  March 14, 8183 10:06 AM  Additional Follow-up for Phone Call Additional follow up Details #1::        Pt. states he is still waiting on his records to come to him in the mail, he will bring them for review as soon as he gets them.  Additional Follow-up by: Vivia Ewing LPN,  March 21, 374 2:52 PM    Additional Follow-up for Phone Call Additional follow up Details #2::    DR.Alexica Schlossberg--Pt. records are on your desk for review. Vivia Ewing LPN  March 30, 4359 6:77 PM   Additional Follow-up for Phone Call Additional follow up Details #3:: Details for Additional Follow-up Action Taken: The records are extremely extensive and to review it will take up lot of my time. ( about 100 pages) Additional Follow-up by: Lafayette Dragon MD,  April 01, 2010 9:41 PM  Per Dr.Rhianna Raulerson--Pt. has an extensive history with Dr.Buccini, she doesn't feel she has anything more to add to his care and it may be in the pt's best interest to remain with Dr.Buccini. Above MD orders reviewed with patient, pt. states Dr.Taran Hable left him a message, "She said she would be happy to take my case and she just needed to ask me a few questions. I am really upset about being told such different things."    DR.Cleopha Indelicato PLEASE ADVISE  PT# [id number].      Vivia Ewing LPN  April 03, 339 3:52 PM  Please see note dated 04-03-10 for follow-up. Vivia Ewing LPN  April 03, 4817 5:90 PM

## 2011-01-16 NOTE — Assessment & Plan Note (Signed)
Summary: TO ESTABLISH WITH DR.Morgane Joerger              [place]   History of Present Illness Visit Type: Initial Visit Primary GI MD: [name] MD Primary Provider: n/a Chief Complaint: Establish with Dr Olevia Perches History of Present Illness:   This is a 50 year old white male with ulcerative colitis since 1995 which was diagnosed by flexible sigmoidoscopy. The disease initially extended from the rectum to 30 cm. He since then has had evidence of pancolitis as of his last colonoscopy in May 2010. He is in excellent functional status but  his disease has remains  very active endoscopically. He has transferred his care from Dr Cristina Gong for several reasons. Dr. Cristina Gong felt that the patient was not compliant in keeping his appointments and on the other side the patient felt that Dr Cristina Gong  was pressuring him to have surgery which he was not ready  to undergo. He was unable to make appointments at  times due to the difficulty in communication with the office personnel. He is currently doing very well on multiple medications. He has had consultation at  [facility] by Dr Koleen Distance as well as in [place] by [facility]. There has been a lot of discussion concerning the need for a total colectomy and ileoproctostomy. Patient is resistant to having any surgery because after talking to several people who had the operation, they  were not necessarily better, still having 5-6 loose bowel movements a day, some of them regretting having the surgery. He is according to himself willing to have a colonoscopy every 2 or 3 years to screen for dysplasia. He is currently having 4 bowel movements on a good day and 7-8 bowel movements on a bad day, which he would be having at best with  He has a very small amount of blood on occasion but not always. The main symptom is pain after having a bowel movement. It feels like a cramping and it lasts at least 10 or 15 minutes. He has anti-spasmodics at home but does not  take them. His medications include Canasa suppositories, probiotics, [name] pen 40 mg weekly, Entocort 90 mg a daily, methotrexate 15 mg weekly and prednisone 8 mg daily being tapered by 1 mg every 2 weeks. He was on Remicade in 2006 and 2007 but switched to [name] because of lack of improvement.   GI Review of Systems    Reports abdominal pain and  bloating.      Denies acid reflux, belching, chest pain, dysphagia with liquids, dysphagia with solids, heartburn, loss of appetite, nausea, vomiting, vomiting blood, weight loss, and  weight gain.      Reports constipation, diarrhea, and  hemorrhoids.     Denies anal fissure, black tarry stools, change in bowel habit, diverticulosis, fecal incontinence, heme positive stool, irritable bowel syndrome, jaundice, light color stool, liver problems, rectal bleeding, and  rectal pain.    Current Medications (verified): 1)  Prednisone 5 Mg Tabs (Prednisone) .... Take 1 Tablet Daily (Total of 8 Mg) 2)  [name] 40 Mg/0.74m Kit (Adalimumab) ..Marland Kitchen. 1 Injection Once Per Week 3)  Methotrexate 2.5 Mg Tabs (Methotrexate Sodium) .... Take 6 Tablets By Mouth Every Friday 4)  Entocort Ec 3 Mg Xr24h-Cap (Budesonide) .... Take 3 Capsules By Mouth Every Morning 5)  Prednisone 1 Mg Tabs (Prednisone) .... Take 3 Tablets By Mouth Once Daily  (Total of 8 Mg Daily)  Past History:  Past Medical History: Current Problems:  ANEMIA, IRON  DEFICIENCY (ICD-280.9) ULCERATIVE COLITIS (ICD-556.9)   Arthritis  Past Surgical History: shoulder surgery left ankle surgery  Family History: Reviewed history from 04/11/2010 and no changes required. Family History of Colitis: Mother No FH of Colon Cancer:  Social History: Reviewed history from 04/11/2010 and no changes required. Patient is a former smoker. -stopped in 0272 Illicit Drug Use - no--did in the remote past Occupation: self employed Alcohol Use - no  Review of Systems       Pertinent positive and negative review  of systems were noted in the above HPI. All other ROS was otherwise negative.   Vital Signs:  Patient profile:   50 year old male Height:      68 inches Weight:      178 pounds BMI:     27.16 BSA:     1.95 Pulse rate:   80 / minute Pulse rhythm:   regular BP sitting:   104 / 78  (left arm)  Vitals Entered By: Reading Deborra Medina) (Apr 18, 2010 11:53 AM)  Physical Exam  General:  alert, oriented, very pleasant, slightly cushingoid. Eyes:  PERRLA, no icterus. Mouth:  No deformity or lesions, dentition normal. Neck:  Supple; no masses or thyromegaly. Lungs:  Clear throughout to auscultation. Heart:  Regular rate and rhythm; no murmurs, rubs,  or bruits. Abdomen:  soft abdomen with normoactive bowel sounds and mild tenderness in left lower quadrant and left middle quadrant. There was no fullness. Liver edge was at costal margin. Rectal:  normal perianal area. Rectal sphincter tone was increased. There was pain on digital exam and possible edema in the anal canal. Stool was Hemoccult positive. Extremities:  No clubbing, cyanosis, edema or deformities noted. Skin:  few ecchymoses. Psych:  Alert and cooperative. Normal mood and affect.   Impression & Recommendations:  Problem # 1:  ULCERATIVE COLITIS (ICD-556.9) Patient has ulcerative colitis of  16 years duration with her last colonoscopy confirming pancolitis. He is currently in symptomatic remission but not in endoscopic remission. He is satisfied with his current condition and would like to continue a prednisone taper at 1 mg every month. We have discussed the total colectomy and ileo-anal pull through and have decided at the moment to postpone the decision  We can keep him on the same medications and add Bentyl 20 mg twice a day on a regular basis. We will also obtain IBD markers which I have not found in his old records after reviewing them extensively for at least 2 hours. We are checking on his blood tests today. His next  colonoscopy would be due in May 2012. Orders: IBD Serology ([facility] #1007) (53664) TLB-CBC Platelet - w/Differential (85025-CBCD) TLB-CMP (Comprehensive Metabolic Pnl) (40347-QQVZ) TLB-Sedimentation Rate (ESR) (85652-ESR)  Problem # 2:  ANEMIA, IRON DEFICIENCY (ICD-280.9) We are checking a CBC and iron studies today Orders:. TLB-CBC Platelet - w/Differential (85025-CBCD) TLB-CMP (Comprehensive Metabolic Pnl) (56387-[id number]) TLB-Sedimentation Rate (ESR) (85652-ESR) IBD Serology ([facility] #1007) (32951)  Other Orders: TLB-IBC Pnl (Iron/FE;Transferrin) (83550-IBC)  Patient Instructions: 1)  continue all medications. 2)  Bentyl 10 mg p.o. b.i.d. , He will call us with the name of the antispamodic he uses. 3)  Continue prednisone taper by 1 mg every 4 weeks. 4)  CBC, iron studies, sedimentation rate, and metabolic panel. 5)  Colonoscopy May 2012. 6)  IBD markers today. 7)  Office visit 8 weeks. 8)  The medication list was reviewed and reconciled.  All changed / newly prescribed medications were explained.  A  complete medication list was provided to the patient / caregiver.

## 2011-01-16 NOTE — Letter (Signed)
Summary: [facility]  [facility]   Imported By: Bubba Hales 04/16/2010 11:56:52  _____________________________________________________________________  External Attachment:    Type:   Image     Comment:   External Document

## 2011-01-16 NOTE — Procedures (Signed)
Summary: [facility]  [facility]   Imported By: Bubba Hales 04/16/2010 12:36:56  _____________________________________________________________________  External Attachment:    Type:   Image     Comment:   External Document

## 2011-01-16 NOTE — Medication Information (Signed)
Summary: [name]/[employer]  [name]/[employer]   Imported By: Phillis Knack 10/24/2010 10:37:05  _____________________________________________________________________  External Attachment:    Type:   Image     Comment:   External Document

## 2011-01-16 NOTE — Progress Notes (Signed)
Summary: Med Change  ---- Converted from flag ---- ---- 04/19/2010 12:47 PM, Lafayette Dragon MD wrote: actually he may do better on Bentyl 20 mg by mouth  two times a day,#60, 1 refill  because it works faster in the morning when he is having the diarrhea---- 04/19/2010 11:07 AM, Vivia Ewing LPN wrote: Pt. called, states you wanted to know the name of this med he has taken in the past: Hyoscyamine 0.361m.  He states it works when he uses it, wants to know if he needs to continue using it or if you want to prescribe something else? Deb ------------------------------  Phone Note Outgoing Call   Call placed by: DVivia EwingLPN,  May  6, 2935730:17PM Call placed to: Patient Summary of Call: Above MD orders reviewed with patient. Pt. instructed to call back as needed.  Initial call taken by: DVivia EwingLPN,  May  6, 2793930:30PM    New/Updated Medications: DICYCLOMINE HCL 20 MG TABS (DICYCLOMINE HCL) Take one by mouth twice daily as needed. Prescriptions: DICYCLOMINE HCL 20 MG TABS (DICYCLOMINE HCL) Take one by mouth twice daily as needed.  #60 x 1   Entered by:   DVivia EwingLPN   Authorized by:   DLafayette DragonMD   Signed by:   DVivia EwingLPN on 009/23/3007  Method used:   Electronically to        [facility][phone] (retail)       [address]       [place]  [address]      Ph: [phone]      Fax: [phone]  RxID:   [id number]

## 2011-01-16 NOTE — Progress Notes (Signed)
Summary: med concern  Phone Note Call from Patient Call back at [phone]  (cell)   Caller: Patient Call For: Dr. Olevia Perches Reason for Call: Talk to Nurse Summary of Call: out of [name]... needs prior auth, he thinks Initial call taken by: Lucien Mons,  October 11, 2010 12:05 PM  Follow-up for Phone Call        Patient came by the office today and states that he has called 4 times and has not gotten a return call. Unfortunately, I have only gotten 1 message and that was sent to me at noon today. I am in clinic and will do the approval as soon as possible but the patient has been advised that we may not be able to get the approval completed at 5 today (he is out of medication and needs it sent out by 5 pm today). I have spoken to Lelan Pons at [facility] who states that she will send the medical director the office notes from [place] to try to get a stat approval. As of 5 pm today, the approval had still not been completed. I have spoken to the patient over the phone and have explained the situation. For some reason, the [employer] rep. says that patient has been getting approved for the prescription without a prior authorization. Patient verbalizes understanding and will also be calling me back on Monday to schedule a follow up appointment. Follow-up by: Madlyn Frankel CMA Deborra Medina),  October 11, 2010 5:44 PM     Appended Document: med concern [name] was approved by [facility]. See scanned document.

## 2011-01-16 NOTE — Assessment & Plan Note (Signed)
Summary: tb test for humira/dn  Nurse Visit   Allergies: No Known Drug Allergies  Immunizations Administered:  PPD Skin Test:    Vaccine Type: PPD    Site: left forearm    Mfr: Sanofi Pasteur    Dose: 0.1 ml    Route: ID    Given by: Madlyn Frankel CMA (AAMA)    Exp. Date: 10/2011    Lot #: [id number]  PPD Results    Date of reading: 08/30/2010    Results: < 36m    Interpretation: negative  Orders Added: 1)  TB Skin Test [86580] 2)  Admin 1st Vaccine [90471]   Orders Added: 1)  TB Skin Test [86580] 2)  Admin 1st Vaccine [[35825]

## 2011-01-16 NOTE — Progress Notes (Signed)
Summary: NEED TB TEST  Phone Note Outgoing Call   Call placed by: Madlyn Frankel CMA Deborra Medina),  August 15, 2010 2:15 PM Call placed to: Patient Summary of Call: I have called to tell patient that after looking through his records, I am unable to find any record of him having a TB skin test this year. Since he is recieving [name] from our office, he needs to have that done as soon as possible for his safety. He stated that he had a TB test earier this year with Dr Buccinni's office and he doesnt know why we dont have that information. I spoke to a staff member at Dr [employer] office who states that the last TB skin test they have on file for him is one from May 2009. I called patient back to advise him that unfortunately, we are still unable to find any documentation of a TB test being done this year. I asked him to come in for the test but he states that he will be out of town and will have to call back. I questioned as to when he would be returning and he said he did not know when he would be returning. Again, explained the importance of keeping up to date with the TB test. I told him we would not be refilling any [name] for him until he comes in for a test and we have negative results. He states that he still has 4 pens left and understands that we will not refill any medication until his test but he will call us back later.  Initial call taken by: Madlyn Frankel CMA (AAMA),  August 15, 2010 2:21 PM     Appended Document: NEED TB TEST I agree with not refilling [name] till TB skin test has been completed. if he does not comply, he will be dismissed for noncompliance. I am getting  displeased with his arguing about his treatments, blood tests etc.

## 2011-01-16 NOTE — Letter (Signed)
Summary: [facility]  [facility]   Imported By: Bubba Hales 04/16/2010 11:59:43  _____________________________________________________________________  External Attachment:    Type:   Image     Comment:   External Document

## 2011-01-16 NOTE — Progress Notes (Signed)
Summary: [name] Refill  Phone Note Call from Patient Call back at [phone] Cell   Caller: Patient Call For: Dr. Olevia Perches Reason for Call: Refill Medication Summary of Call: pt is needing a refill on [name]...[facility]  [phone] Initial call taken by: Webb Laws,  May 15, 2010 12:09 PM  Follow-up for Phone Call        Refills authorized, pt. aware. Pt. instructed to call back as needed.  Follow-up by: Vivia Ewing LPN,  May 16, 9023 09:73 PM    New/Updated Medications: [name] 40 MG/0.8ML KIT (ADALIMUMAB) 45m Subcutaneously weekly. .Prescriptions: [name] 40 MG/0.8ML KIT (ADALIMUMAB) 429mSubcutaneously weekly.  #4 x 12   Entered by:   DeVivia EwingPN   Authorized by:   DoLafayette DragonD   Signed by:   DeVivia EwingPN on [id number]/29/9242 Method used:   Telephoned to ...       [phone](retail)       [address]      [place]  [id number]     Ph: [phone]     Fax: [phone] RxID:   [id number]

## 2011-03-06 ENCOUNTER — Encounter: Payer: Self-pay | Admitting: *Deleted

## 2011-03-06 ENCOUNTER — Telehealth: Payer: Self-pay | Admitting: *Deleted

## 2011-03-06 NOTE — Telephone Encounter (Signed)
Message copied by Dixon Boos on Thu Mar 06, 2011  5:02 PM ------      Message from: [place], [place]      Created: Mon Feb 17, 2011  5:24 PM       Patient needs to schedule colonoscopy BEFORE may 2012 (see Dr Diona Browner note on 11/13/10) call patient to schedule if he has not already done so.

## 2011-03-07 ENCOUNTER — Other Ambulatory Visit: Payer: Self-pay | Admitting: Internal Medicine

## 2011-03-10 ENCOUNTER — Other Ambulatory Visit: Payer: Self-pay | Admitting: Internal Medicine

## 2011-03-10 MED ORDER — METHOTREXATE 2.5 MG PO TABS: ORAL_TABLET | ORAL | Status: DC

## 2011-03-10 NOTE — Telephone Encounter (Signed)
rx sent to patient's pharmacy for 1 month supply methotrexate until his colonoscopy appt on May 18th 2012

## 2011-03-25 ENCOUNTER — Other Ambulatory Visit: Payer: Self-pay | Admitting: Internal Medicine

## 2011-04-04 ENCOUNTER — Other Ambulatory Visit: Payer: Self-pay | Admitting: Internal Medicine

## 2011-04-04 NOTE — Telephone Encounter (Signed)
I have gotten a note from [name] for patient with a BCBS Prior Authorization for [name] Quantity Exception. I have documentation from [facility] that patient has been approved to have 160 units of [name] (=1 40 mg injection q week) that is good thru 07/06/2013. Per Kyung Rudd at [facility], the issue has been taken care of and [facility] should be able to [facility] the [facility]. I have also called and spoken to Holly Bodily with [facility] (who sent me the fax) and she is now able to put the prescription thru.

## 2011-04-22 ENCOUNTER — Ambulatory Visit (AMBULATORY_SURGERY_CENTER): Payer: BC Managed Care – PPO | Admitting: *Deleted

## 2011-04-22 VITALS — Ht 68.0 in | Wt 167.2 lb

## 2011-04-22 DIAGNOSIS — K5190 Ulcerative colitis, unspecified, without complications: Secondary | ICD-10-CM

## 2011-04-22 MED ORDER — PEG-KCL-NACL-NASULF-NA ASC-C 100 G PO SOLR: ORAL | Status: DC

## 2011-04-23 ENCOUNTER — Encounter: Payer: Self-pay | Admitting: Internal Medicine

## 2011-04-29 NOTE — Op Note (Signed)
[name], [name] NO.:  [id number]   MEDICAL RECORD NO.:  [id number]          PATIENT TYPE:  INP   LOCATION:  7342                         FACILITY:  [facility]   PHYSICIAN:  Newt Minion, MD     DATE OF BIRTH:  08/05/1961   DATE OF PROCEDURE:  05/03/2007  DATE OF DISCHARGE:                               OPERATIVE REPORT   PREOPERATIVE DIAGNOSES:  1. Open pilon fracture of the left ankle.  2. Fibular fracture left ankle.   POSTOPERATIVE DIAGNOSES:  1. Open pilon fracture of the left ankle.  2. Fibular fracture left ankle.   PROCEDURE:  1. Irrigation and debridement traumatic wound.  2. Closure of traumatic wound 4 cm in length.  3. Open reduction internal fixation fibular fracture.  4. Open reduction internal fixation fibular pilon fracture with an      anterior lateral Synthes plate.   SURGEON:  Newt Minion, M.D.   ANESTHESIA:  General.   ESTIMATED BLOOD LOSS:  Minimal.   ANTIBIOTICS:  1 g of Kefzol.   DRAINS:  None.   COMPLICATIONS:  None.   TOURNIQUET TIME:  None.   DISPOSITION:  To PACU in stable condition.   INDICATIONS FOR PROCEDURE:  The patient is a 50 year old gentleman who  is status post closed reduction of a right shoulder dislocation as well  as status post irrigation and debridement of an open pilon fracture on  the left.  The patient initially underwent irrigation and debridement at  presentation in the emergency room and presents at this time  approximately five hours after the initial irrigation and debridement  for repeat irrigation and debridement and definitive internal fixation.  Risks and benefits of surgery were discussed with the patient including  infection, neurovascular injury, arthritis, DVT, need for additional  surgery.  The patient states he understands and wished to proceed at  this time.   DESCRIPTION PROCEDURE:  The patient was brought to the OR room five and  underwent a general anesthetic.  After  adequate level of anesthesia  obtained, the patient's left lower extremity was prepped using DuraPrep  and draped into a sterile field.  Attention was first focused to the  open medial malleolar fracture.  Again, using pulse lavage and 3 liters  of normal saline, the medial malleolar open wound was irrigated with  pulse lavage.  The traumatic wound was approximately 4 cm in length.  This was over the medial malleolus and was clean.  There was no  contamination within the wound.  Due to the cleanliness of the open  wound, the patient underwent two screws 207 x 45 mm in length, 3.5 in  diameter, fixation of the medial malleolar fracture.  The wound skin was  then closed, traumatic wound was closed using 2-0 nylon with a modified  vertical mattress suture.  The wound closed without any tension on the  skin.  There was no ischemic or necrotic wound edge changes.  Attention  was then focused on the pilon and fibular fracture.  An incision was  made anterolaterally.  This curved across the joint medially  with the  apex at the joint.  Blunt dissection was carried down.  The superficial  peroneal nerve was identified and retracted laterally.  The blunt  dissection was carried down to the fibular fracture which was reduced  and stabilized with a five-hole 1/3 tubular plate with two screws  proximally and two screws distally with a plate applied antiglide  fashion.  Attention was then focused at the pilon fracture.  Blunt  dissection was carried down to the tibia.  The fracture was reduced.  The joint line was reduced and congruent, and an anterolateral plate was  applied, and this was secured distally and proximally with locking  screws.  C-arm fluoroscopy was used to verify reduction.  The wounds  were again irrigated with normal saline.  The skin was closed without  any tension on the skin with a far-near near-far 2-0 nylon suture  interrupted with modified vertical mattress sutures.  The wounds  were  covered with Adaptic orthopedic sponges, sterile Webril and Coban  dressing.  The patient was extubated, taken to PACU in stable condition.  Plan for 23-hour observation with IV antibiotics and discharge to home  with non-weightbearing on the left.      Newt Minion, MD  Electronically Signed     MVD/MEDQ  D:  05/03/2007  T:  05/04/2007  Job:  [id number]

## 2011-04-29 NOTE — Op Note (Signed)
[name], [name] NO.:  [id number]   MEDICAL RECORD NO.:  [id number]          PATIENT TYPE:  INP   LOCATION:  9417                         FACILITY:  [facility]   PHYSICIAN:  Newt Minion, MD     DATE OF BIRTH:  [birth year]   DATE OF PROCEDURE:  05/03/2007  DATE OF DISCHARGE:                               OPERATIVE REPORT   PREOPERATIVE EVALUATION:  1. Dislocation of the right shoulder.  2. Opened pilon fracture of the left ankle.   POSTOPERATIVE DIAGNOSES:  1. Dislocation of the right shoulder.  2. Opened pilon fracture of the left ankle.   PROCEDURE:  1. Closed reduction of the right shoulder with conscious sedation with      etomidate per the emergency room doctor, Dr. Winfred Leeds.  2. Irrigation and debridement of the left ankle with application of a      sterile Betadine dressing.   SURGEON:  .Newt Minion, MD   ANESTHESIA:  Conscious sedation with etomidate by Dr. Winfred Leeds.   DRAINS:  None.   COMPLICATIONS:  None.   DISPOSITION:  To 5000.   ANTIBIOTICS:  One gram of Kefzol, gentamicin per pharmacy and penicillin  per pharmacy.   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  The patient was alert and oriented.  No head trauma.  No back,  neck or pelvic trauma.  He presents with the isolated injuries as  mentioned above.  Examination showed both upper and lower extremities to  be neurovascularly intact.  Assessment:  Dislocation of the right  shoulder and open pilon fracture of the left ankle.   PLAN:  After conscious sedation by Dr. Winfred Leeds with etomidate, the  patient underwent closed reduction of the right shoulder without  complications and underwent irrigation and debridement of the left ankle  with application of a sterile dressing.  Post-reduction  radiographs  showed a reduced right shoulder and radiographs showed the pilon  fracture with a fibular fracture as well as medial malleolar fracture of  the left ankle.  The patient received the antibiotics and was scheduled  for surgical intervention.  There was minimal swelling and minimal soft  tissue injury.  The open wound was over the medial malleolus.  This was  a clean open wound with no evidence of contamination.      Newt Minion, MD  Electronically Signed     MVD/MEDQ  D:  05/03/2007  T:  05/04/2007  Job:  [id number]

## 2011-05-02 ENCOUNTER — Encounter: Payer: Self-pay | Admitting: Internal Medicine

## 2011-05-02 ENCOUNTER — Ambulatory Visit (AMBULATORY_SURGERY_CENTER): Payer: BC Managed Care – PPO | Admitting: Internal Medicine

## 2011-05-02 VITALS — BP 107/70 | HR 56 | Temp 97.4°F | Resp 18 | Ht 68.0 in | Wt 161.0 lb

## 2011-05-02 DIAGNOSIS — R197 Diarrhea, unspecified: Secondary | ICD-10-CM

## 2011-05-02 DIAGNOSIS — K5190 Ulcerative colitis, unspecified, without complications: Secondary | ICD-10-CM

## 2011-05-02 DIAGNOSIS — D126 Benign neoplasm of colon, unspecified: Secondary | ICD-10-CM

## 2011-05-02 MED ORDER — SODIUM CHLORIDE 0.9 % IV SOLN: 500.0000 mL | INTRAVENOUS | Status: DC

## 2011-05-02 NOTE — Discharge Summary (Signed)
NAMEANGUEL, DELAPENA NO.:  [id number]   MEDICAL RECORD NO.:  [id number]          PATIENT TYPE:  INP   LOCATION:  1017                         FACILITY:  [facility]   PHYSICIAN:  Wonda Horner, M.D.   DATE OF BIRTH:  Jan 13, 1961   DATE OF ADMISSION:  08/28/2006  DATE OF DISCHARGE:  09/03/2006                                 DISCHARGE SUMMARY   Mr. Kowalski was admitted on September 14.  He is being discharged today  which is December 20.  His admission and discharge diagnosis is ulcerative  colitis.  He was on Dr. Perley Jain service with [facility].  When Mr. Lecomte was  admitted last Friday evening, he was having bloody diarrhea every 1-3 hours  with severe cramping.  He was treated here with IV Solu-Medrol 80 mg t.i.d.  as well as Rowasa enemas and Librax 1 capsule t.i.d.- q.i.d.  His diarrhea  has decreased; his creatinine has decreased substantially.  He is tolerating  a regular diet, and he feels that he is ready to go home.  While he was  here, we did stool cultures; Giardia and Cryptosporidium came back negative  as did the C. difficile;  the others are still outstanding.  He is being  sent home with 40 mg of prednisone b.i.d., Rowasa enemas once at bedtime,  Librax 1 capsule up to q.i.d. p.r.n. cramping.  He is to follow up with Dr.  Cristina Gong in 1-2 weeks.  I have called the office, and they will call him with  a specific appointment time.      Melton Alar, PA    ______________________________  Wonda Horner, M.D.    MLY/MEDQ  D:  09/03/2006  T:  09/04/2006  Job:  [id number]   cc:   Ronald Lobo, M.D.  Jeryl Columbia, M.D.

## 2011-05-02 NOTE — Patient Instructions (Signed)
Colitis-left U.C. In the sigmoid colon Multiple polyps Await pathology  Continue present therapy Repeat exam in 2 yrs

## 2011-05-02 NOTE — Op Note (Signed)
NAME:  [name], RARICK NO.:  [id number]   MEDICAL RECORD NO.:  [id number]          PATIENT TYPE:  AMB   LOCATION:  ENDO                         FACILITY:  [facility]   PHYSICIAN:  Ronald Lobo, M.D.   DATE OF BIRTH:  [birth year]   DATE OF PROCEDURE:  02/11/2005  DATE OF DISCHARGE:                                 OPERATIVE REPORT   PROCEDURE:  Colonoscopy with biopsy.   INDICATION:  A 51 year old gentleman with a longstanding history of  ulcerative colitis, with recent exacerbation requiring high doses of  prednisone to try to control symptoms.   FINDINGS:  Severe colitis extending almost to the cecum.   PROCEDURE:  The nature, purpose, and risks of the procedure had been  discussed with the patient who provided written consent. Sedation was  fentanyl 50 mcg and Versed 5 milligrams plus Phenergan 12.5 milligrams IV  without arrhythmias or desaturation. The patient had prolapsed internal  hemorrhoids without obvious thrombosis but they were succulent and engorged.  I did not get a good feel of the prostate gland due to rectal discomfort on  examination related to the hemorrhoids.   The Olympus adjustable tension pediatric video colonoscope was easily  advanced around the colon to the cecum as identified by clear visualization  of the appendiceal orifice. I attempted to enter the terminal ileum but was  not able to do so, although there was no evidence of any ulceration, frank  stenosis or anatomic abnormalities in the cecum.   This procedure was done unprepped but, despite that, there was essentially  no stool throughout the colon, just a little bit of mucoid and slightly  solid stool residue in the cecum.   This exam was remarkable for the presence of confluent pancolitis up to the  level of the cecum or proximal ascending colon. That colitis was  characterized by complete loss of vascularity, intense erythema,  granularity, and areas of exudate and pinpoint  hemorrhages. There was a  fairly sharp transition zone just above the cecum so that the proximal 5-10  cm of the colon had normal appearing mucosa and biopsies were obtained from  that area separately as well as randomly from around the colon.  Overall, I  would rate the intensity of the colitis as approximately seven or eight  scale of 10.   No polyps, masses or diverticulosis were noted. I did not retroflex in the  rectum.   The patient tolerated the procedure well and there no apparent  complications.   IMPRESSION:  Active, severe nearly pancolitis consistent with his underlying  diagnosis of ulcerative colitis.   PLAN:  Await pathology results. Consider intensification medical therapy.      RB/MEDQ  D:  02/11/2005  T:  02/11/2005  Job:  [id number]

## 2011-05-02 NOTE — H&P (Signed)
NAME:  [name], [name] NO.:  [id number]   MEDICAL RECORD NO.:  [id number]          PATIENT TYPE:  INP   LOCATION:  6738                         FACILITY:  [facility]   PHYSICIAN:  Jeryl Columbia, M.D.    DATE OF BIRTH:  Feb 03, 1961   DATE OF ADMISSION:  08/28/2006  DATE OF DISCHARGE:                                HISTORY & PHYSICAL   HISTORY:  The patient well known to my partner, Dr. Cristina Gong, with  longstanding ulcerative colitis, has had multiple medical trials of multiple  medicines, been to [facility], where they have recommended surgery, but he  has been hesitant to proceed such an aggressive approach but, unfortunately,  he has required increasing prednisone doses and despite 80 mg daily,  continues to have independent pain, cramps, bloody diarrhea, weakness, and  fatigue and finally has agreed to admission for bowel rest and IV steroids.   PAST MEDICAL HISTORY:  1. Ulcerative colitis.  2. Knee surgery.   CURRENT MEDICINES:  Bentyl and prednisone.   ALLERGIES:  None.   SOCIAL HISTORY:  Pertinent for currently does not smoke or drink, minimizes  other over-the-counter medicine use.   FAMILY HISTORY:  Pertinent for a mother with ulcerative colitis.   REVIEW OF SYSTEMS:  Pertinent for no fevers, chills, night sweats.  No  urinary complaints.  No skin, eye, or joint complaints.  He does say bowel  rest and IV steroids at [facility] in March, helped him significantly.  He  has failed Remicade and 6-MP in the past.   PHYSICAL EXAMINATION:  GENERAL:  No acute distress, lying comfortably in the  bed currently.  HEENT:  Sclerae nonicteric.  NECK:  Supple without adenopathy.  Throat is clear.  LUNGS:  Clear.  HEART:  Regular rate and rhythm.  ABDOMEN:  Soft, currently not tender, decreased but present bowel sounds.   Labs and x-rays pending at the of dictation.   ASSESSMENT:  Worsening ulcerative colitis.   PLAN:  1. Clear liquids only, IV Solu-Medrol 80:8  to start with.  Trial of Librax      and then Phenergan and Tylenol as needed.  2. Await x-rays and labs, possibly get an abdominal CT to reassess.  Dr.      Cristina Gong has done a recent flexible sigmoidoscopy in the office      unprepped which did show significant disease.  He will thinking about      surgical options with further work-up and plans pending clinical      course.           ______________________________  Jeryl Columbia, M.D.     MEM/MEDQ  D:  08/28/2006  T:  08/28/2006  Job:  [id number]   cc:   Ronald Lobo, M.D.

## 2011-05-05 ENCOUNTER — Telehealth: Payer: Self-pay

## 2011-05-05 NOTE — Telephone Encounter (Signed)
Left message on answering machine. 

## 2011-05-08 ENCOUNTER — Encounter: Payer: Self-pay | Admitting: Internal Medicine

## 2011-05-15 ENCOUNTER — Other Ambulatory Visit: Payer: Self-pay | Admitting: Internal Medicine

## 2011-05-29 ENCOUNTER — Other Ambulatory Visit: Payer: Self-pay | Admitting: Internal Medicine

## 2011-08-20 ENCOUNTER — Telehealth: Payer: Self-pay | Admitting: *Deleted

## 2011-08-20 DIAGNOSIS — K5090 Crohn's disease, unspecified, without complications: Secondary | ICD-10-CM

## 2011-08-20 NOTE — Telephone Encounter (Signed)
Dr Olevia Perches, what labs would you like to be drawn for patient? He requests that a CBC and B12 level be drawn in addition to whichever labs you would like drawn. Also, he complains of several weeks lower body aches. He states that he does run quite often and was attributing that to the aches but he would like to know your opinion. He is on [name] and recently discontinued Prednisone.

## 2011-08-20 NOTE — Telephone Encounter (Signed)
Message copied by Larina Bras on Wed Aug 20, 2011  3:43 PM ------      Message from: Ronalee Red I      Created: Wed Aug 20, 2011  2:02 PM       Patient wants to speak to you before scheduling any appts. Please call him on his cell [phone]

## 2011-08-21 NOTE — Telephone Encounter (Signed)
Left message on voicemail. Advised patient that he should discuss concerns at an office visit. I have also advised him that his PCP should be taking care of his bone density if needed.

## 2011-08-21 NOTE — Telephone Encounter (Signed)
CBC,C-met,,B12, Iron studies. 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

## 2011-08-21 NOTE — Telephone Encounter (Signed)
Patient states that he wants a bone density since he has been on prednisone for so long. States that he "keeps a check on it every year." He also states that he will keep in mind the ibuprofen prn. He has multiple questions and concerns and I have advised him that it may be best for him to come for an office visit to address his multiple concerns. Would you like me to order bone density or have him come for office visit first?

## 2011-08-21 NOTE — Telephone Encounter (Signed)
We will discuss it at his next visit. His PCP should be doing it.

## 2011-08-25 ENCOUNTER — Other Ambulatory Visit (INDEPENDENT_AMBULATORY_CARE_PROVIDER_SITE_OTHER): Payer: BC Managed Care – PPO

## 2011-08-25 ENCOUNTER — Ambulatory Visit (INDEPENDENT_AMBULATORY_CARE_PROVIDER_SITE_OTHER): Payer: BC Managed Care – PPO | Admitting: Internal Medicine

## 2011-08-25 DIAGNOSIS — K5190 Ulcerative colitis, unspecified, without complications: Secondary | ICD-10-CM

## 2011-08-25 DIAGNOSIS — K5090 Crohn's disease, unspecified, without complications: Secondary | ICD-10-CM

## 2011-08-25 LAB — COMPREHENSIVE METABOLIC PANEL
ALT: 8 U/L (ref 0–53)
AST: 11 U/L (ref 0–37)
CO2: 28 mEq/L (ref 19–32)
Calcium: 8.6 mg/dL (ref 8.4–10.5)
Chloride: 102 mEq/L (ref 96–112)
Creatinine, Ser: 0.8 mg/dL (ref 0.4–1.5)
GFR: 103.55 mL/min (ref >60.00)
Potassium: 4.2 mEq/L (ref 3.5–5.1)
Sodium: 137 mEq/L (ref 135–145)
Total Protein: 7 g/dL (ref 6.0–8.3)

## 2011-08-25 LAB — CBC WITH DIFFERENTIAL/PLATELET
Basophils Absolute: 0 10*3/uL (ref 0.0–0.1)
Eosinophils Absolute: 0.1 10*3/uL (ref 0.0–0.7)
HCT: 37.8 % — ABNORMAL LOW (ref 39.0–52.0)
Hemoglobin: 12.1 g/dL — ABNORMAL LOW (ref 13.0–17.0)
Lymphocytes Relative: 9.8 % — ABNORMAL LOW (ref 12.0–46.0)
Lymphs Abs: 1 10*3/uL (ref 0.7–4.0)
MCHC: 31.9 g/dL (ref 30.0–36.0)
Neutro Abs: 8.4 10*3/uL — ABNORMAL HIGH (ref 1.4–7.7)
Platelets: 460 10*3/uL — ABNORMAL HIGH (ref 150.0–400.0)
RDW: 17.1 % — ABNORMAL HIGH (ref 11.5–14.6)

## 2011-08-25 LAB — VITAMIN B12: Vitamin B-12: >1500 pg/mL — ABNORMAL HIGH (ref 211–911)

## 2011-08-27 ENCOUNTER — Encounter: Payer: Self-pay | Admitting: Internal Medicine

## 2011-08-27 ENCOUNTER — Telehealth: Payer: Self-pay | Admitting: *Deleted

## 2011-08-27 ENCOUNTER — Other Ambulatory Visit: Payer: Self-pay | Admitting: *Deleted

## 2011-08-27 DIAGNOSIS — D649 Anemia, unspecified: Secondary | ICD-10-CM

## 2011-08-27 LAB — READ PPD: TB Skin Test: NEGATIVE mm

## 2011-08-27 NOTE — Progress Notes (Signed)
  Subjective:    Patient ID: [name], male    DOB: 1960-12-27, 50 y.o.   MRN: [id number]  HPI    Review of Systems     Objective:   Physical Exam  error      Assessment & Plan:

## 2011-08-27 NOTE — Telephone Encounter (Signed)
Message copied by Hulan Saas on Wed Aug 27, 2011  8:28 AM ------      Message from: Lafayette Dragon      Created: Tue Aug 26, 2011  9:34 PM       Please call pt with       +      +      +      +      +      +      +      +      +      +      most of the labs being normal except for severe iron deficiency and low blood count. He       Must not be taking his Iron. If he is not going to take it, tell him , we will have to set him up for an iron infusion. Take Iron daily. Repeat CBC 6 weeks.

## 2011-08-27 NOTE — Telephone Encounter (Signed)
Spoke with patient and gave him the results. He states he has been taking his iron pill but is not sure the mg of it. He will make sure it is 325 mg. He understands he needs to take it daily and that we will recheck in 6 weeks.

## 2011-08-27 NOTE — Telephone Encounter (Signed)
Labs in [facility] for 10/05/11. Note to remind patient. Patient's line is busy will try later.

## 2011-09-05 ENCOUNTER — Other Ambulatory Visit: Payer: Self-pay | Admitting: Internal Medicine

## 2011-09-08 ENCOUNTER — Telehealth: Payer: Self-pay | Admitting: *Deleted

## 2011-09-08 NOTE — Telephone Encounter (Signed)
We got a note from [facility] on [place]. Stating that the rx we sent for methrotrexate could not be filled because it is not currently available from the manufacturer. I have called and spoken to [facility] who states that they do have #24 tablets available. I have given a verbal order for them to give patient #24 tablets. I have left a message for patient to call back.

## 2011-09-09 NOTE — Telephone Encounter (Signed)
Patient's original pharmacy [facility]) has now found methotrexate for patient.

## 2011-09-15 ENCOUNTER — Telehealth: Payer: Self-pay | Admitting: Internal Medicine

## 2011-09-15 NOTE — Telephone Encounter (Signed)
Left message for patient that this is the first return call I have been made aware of. However, I was originally calling about shortage of methotrexate at pharmacy but his pharmacy was able to find more medicine, therefore he no longer needs to return my call unless he has questions.

## 2011-09-24 ENCOUNTER — Other Ambulatory Visit: Payer: Self-pay | Admitting: *Deleted

## 2011-09-24 MED ORDER — ADALIMUMAB 40 MG/0.8ML ~~LOC~~ KIT: PACK | SUBCUTANEOUS | Status: DC

## 2011-09-25 ENCOUNTER — Telehealth: Payer: Self-pay | Admitting: *Deleted

## 2011-09-25 MED ORDER — ADALIMUMAB 40 MG/0.8ML ~~LOC~~ KIT: PACK | SUBCUTANEOUS | Status: DC

## 2011-09-25 NOTE — Telephone Encounter (Signed)
Received an E scribe rx  Yesterday for [name] injections  QOD. They have had rx for weekly injections. The # ordered for patient was for weekly injections. Wants to clarify correct dose for patient.

## 2011-09-25 NOTE — Telephone Encounter (Signed)
Yes, it should be for once WEEKLY, not every other week. I have spoken to pharmacy and have corrected this.

## 2011-09-25 NOTE — Telephone Encounter (Signed)
Addended by: Larina Bras on: 09/25/2011 11:45 AM   Modules accepted: Orders

## 2011-09-29 ENCOUNTER — Telehealth: Payer: Self-pay | Admitting: Internal Medicine

## 2011-09-29 DIAGNOSIS — D649 Anemia, unspecified: Secondary | ICD-10-CM

## 2011-09-29 NOTE — Telephone Encounter (Signed)
Patient walked into office and wanted to add thyroid and iron level to his CBC that is due. Is this okay? Please, advise

## 2011-09-30 ENCOUNTER — Other Ambulatory Visit (INDEPENDENT_AMBULATORY_CARE_PROVIDER_SITE_OTHER): Payer: BC Managed Care – PPO

## 2011-09-30 ENCOUNTER — Encounter: Payer: Self-pay | Admitting: Gastroenterology

## 2011-09-30 DIAGNOSIS — K5190 Ulcerative colitis, unspecified, without complications: Secondary | ICD-10-CM

## 2011-09-30 DIAGNOSIS — D649 Anemia, unspecified: Secondary | ICD-10-CM

## 2011-09-30 LAB — IBC PANEL
Iron: 13 ug/dL — ABNORMAL LOW (ref 42–165)
Saturation Ratios: 4.1 % — ABNORMAL LOW (ref 20.0–50.0)
Transferrin: 227.3 mg/dL (ref 212.0–360.0)

## 2011-09-30 LAB — CBC WITH DIFFERENTIAL/PLATELET
Basophils Absolute: 0.1 10*3/uL (ref 0.0–0.1)
Eosinophils Relative: 2.1 % (ref 0.0–5.0)
HCT: 36.7 % — ABNORMAL LOW (ref 39.0–52.0)
Lymphocytes Relative: 10.5 % — ABNORMAL LOW (ref 12.0–46.0)
Monocytes Relative: 8.8 % (ref 3.0–12.0)
Neutrophils Relative %: 78.1 % — ABNORMAL HIGH (ref 43.0–77.0)
Platelets: 517 10*3/uL — ABNORMAL HIGH (ref 150.0–400.0)
RDW: 16.7 % — ABNORMAL HIGH (ref 11.5–14.6)
WBC: 11.6 10*3/uL — ABNORMAL HIGH (ref 4.5–10.5)

## 2011-09-30 LAB — TSH: TSH: 1.21 u[IU]/mL (ref 0.35–5.50)

## 2011-09-30 NOTE — Telephone Encounter (Signed)
Left a message for patient re: labs added

## 2011-09-30 NOTE — Telephone Encounter (Signed)
OK to add TSH and CBC

## 2011-10-01 ENCOUNTER — Telehealth: Payer: Self-pay | Admitting: *Deleted

## 2011-10-01 DIAGNOSIS — K529 Noninfective gastroenteritis and colitis, unspecified: Secondary | ICD-10-CM

## 2011-10-01 DIAGNOSIS — D649 Anemia, unspecified: Secondary | ICD-10-CM

## 2011-10-01 NOTE — Telephone Encounter (Signed)
He is having ongoing blood loss from hid colitis. Make sure he takes his iron daily, in fact he should increase it ti bid.. Recheck CBC in 2 months.

## 2011-10-01 NOTE — Telephone Encounter (Signed)
Spoke with patient and gave him the results of labs. He is concerned that his Hgb and iron have not "come up" when he is taking the supplement from the [facility]. Wants to know if Dr. Olevia Perches wants to prescribe iron for him instead of the OTC he got from the [facility] store. Please, advise.

## 2011-10-01 NOTE — Telephone Encounter (Signed)
Message copied by Hulan Saas on Wed Oct 01, 2011 10:52 AM ------      Message from: [place] Dragon      Created: Tue Sep 30, 2011  8:08 PM       Please call pt with stable H/H and normal TSH. Continue Iron

## 2011-10-02 NOTE — Telephone Encounter (Signed)
Spoke with patient and gave him Dr. Nichola Sizer recommendations. He will increase the iron to BID. Labs in [facility] for 2 month CBC. Note to remind patient

## 2011-11-03 ENCOUNTER — Other Ambulatory Visit: Payer: Self-pay | Admitting: Internal Medicine

## 2011-11-04 ENCOUNTER — Telehealth: Payer: Self-pay | Admitting: Internal Medicine

## 2011-11-04 MED ORDER — METHOTREXATE 2.5 MG PO TABS: ORAL_TABLET | ORAL | Status: DC

## 2011-11-04 NOTE — Telephone Encounter (Signed)
Patient states that he changed pharmacies and wasn't given any refills at the time of change. I [facility] [address] per patient request.

## 2011-11-11 ENCOUNTER — Other Ambulatory Visit: Payer: Self-pay | Admitting: Internal Medicine

## 2011-11-11 NOTE — Telephone Encounter (Signed)
Left message for patient that I have faxed the authorization 2 times. I have also sent it again today.

## 2011-11-12 ENCOUNTER — Encounter: Payer: Self-pay | Admitting: Internal Medicine

## 2011-11-12 NOTE — Telephone Encounter (Signed)
Error

## 2011-11-13 NOTE — Telephone Encounter (Signed)
I have spoken to patient. He states that he now has a 5,000 dollar deductible and is unable to purchase [name] since it will initially cost him 4,700 dollars until his deductible is covered. He states that he already has a [name] card and has called [facility] [place] for assistance without much luck. He asks if there are any other options or if we have samples. I have advised him that typically, we work with [employer] and use [name] cards for patient assistance. However, since he has insurance, he will have to pay deductible because there is not much help out there for individuals who do have insurance. I have also advised him that we do not carry samples of [name]. We usually use either remicade or [name] on severe crohn's disease, however, he previously had negative side effects to Remicade so we cannot use that. I also advised that the two medications would more than likely cost about the same anyways. Patient verbalizes understanding and states that he will call back for further instruction if he is not able to afford the medication.

## 2011-11-13 NOTE — Telephone Encounter (Signed)
Reviewed and agree. If he goes off [name], he will have to start Prednisone.

## 2011-11-17 ENCOUNTER — Other Ambulatory Visit: Payer: Self-pay | Admitting: Internal Medicine

## 2011-11-19 ENCOUNTER — Telehealth: Payer: Self-pay | Admitting: Internal Medicine

## 2011-11-19 NOTE — Telephone Encounter (Signed)
Form was signed by Dr Olevia Perches this morning and was faxed directly afterward.

## 2011-11-21 ENCOUNTER — Telehealth: Payer: Self-pay | Admitting: Internal Medicine

## 2011-11-21 NOTE — Telephone Encounter (Signed)
Advised patient that the patient assistance form was sent to [employer]. I also advised him that we signed for the [name] samples to be sent, but they do not ship out for 48 hours, therefore, we probably wont get them until Monday. Patient verbalizes understanding.

## 2011-11-26 ENCOUNTER — Telehealth: Payer: Self-pay | Admitting: *Deleted

## 2011-11-26 NOTE — Telephone Encounter (Signed)
He does not have to go on prednisone, He is still on Entecort 43m, 3 tabs/day. Hopefully he will get his [name] within a week.

## 2011-11-26 NOTE — Telephone Encounter (Signed)
Patient has been off [name] now for 3 weeks (we are waiting on [name] samples to come in). Do you need him to start prednisone or anything while he is waiting on the [name]? He states that he is currently doing okay.

## 2011-11-26 NOTE — Telephone Encounter (Signed)
Left voicemail for patient that I still have not received any [name] samples as of yet. I have advised him that per Dr Nichola Sizer verbal orders, he may begin taking samples of Asacol HD 2 tablets 2 times daily until his samples come in. Patient to call back.

## 2011-11-27 ENCOUNTER — Telehealth: Payer: Self-pay | Admitting: *Deleted

## 2011-11-27 NOTE — Telephone Encounter (Signed)
Left a message for patient that his labs are due next week.

## 2011-11-27 NOTE — Telephone Encounter (Signed)
Message copied by Hulan Saas on Thu Nov 27, 2011  8:12 AM ------      Message from: Hulan Saas      Created: Thu Oct 02, 2011  9:05 AM       Call and remind patient he is due for CBC(DB)

## 2011-12-04 ENCOUNTER — Telehealth: Payer: Self-pay | Admitting: *Deleted

## 2011-12-04 NOTE — Telephone Encounter (Signed)
Spoke with patient and he will come by in the next few days to have labs done.

## 2011-12-04 NOTE — Telephone Encounter (Signed)
Message copied by Hulan Saas on Thu Dec 04, 2011  4:18 PM ------      Message from: Hulan Saas      Created: Thu Nov 27, 2011  8:13 AM       Did patient get CBC for DB

## 2011-12-06 ENCOUNTER — Ambulatory Visit: Payer: BC Managed Care – PPO

## 2011-12-06 DIAGNOSIS — R509 Fever, unspecified: Secondary | ICD-10-CM

## 2011-12-06 DIAGNOSIS — J209 Acute bronchitis, unspecified: Secondary | ICD-10-CM

## 2011-12-18 ENCOUNTER — Other Ambulatory Visit: Payer: Self-pay | Admitting: Internal Medicine

## 2011-12-18 ENCOUNTER — Other Ambulatory Visit (INDEPENDENT_AMBULATORY_CARE_PROVIDER_SITE_OTHER): Payer: BC Managed Care – PPO

## 2011-12-18 ENCOUNTER — Other Ambulatory Visit: Payer: BC Managed Care – PPO

## 2011-12-18 DIAGNOSIS — K529 Noninfective gastroenteritis and colitis, unspecified: Secondary | ICD-10-CM

## 2011-12-18 DIAGNOSIS — D649 Anemia, unspecified: Secondary | ICD-10-CM

## 2011-12-18 DIAGNOSIS — K5289 Other specified noninfective gastroenteritis and colitis: Secondary | ICD-10-CM

## 2011-12-18 LAB — CBC WITH DIFFERENTIAL/PLATELET
Eosinophils Relative: 3.2 % (ref 0.0–5.0)
HCT: 36 % — ABNORMAL LOW (ref 39.0–52.0)
Hemoglobin: 11.6 g/dL — ABNORMAL LOW (ref 13.0–17.0)
Lymphocytes Relative: 16.7 % (ref 12.0–46.0)
Lymphs Abs: 1.3 10*3/uL (ref 0.7–4.0)
Monocytes Relative: 8.4 % (ref 3.0–12.0)
Neutro Abs: 5.5 10*3/uL (ref 1.4–7.7)
Platelets: 569 10*3/uL — ABNORMAL HIGH (ref 150.0–400.0)
WBC: 7.9 10*3/uL (ref 4.5–10.5)

## 2011-12-19 ENCOUNTER — Telehealth: Payer: Self-pay | Admitting: *Deleted

## 2011-12-19 DIAGNOSIS — D509 Iron deficiency anemia, unspecified: Secondary | ICD-10-CM

## 2011-12-19 LAB — IBC PANEL
Iron: 25 ug/dL — ABNORMAL LOW (ref 42–165)
Saturation Ratios: 8.1 % — ABNORMAL LOW (ref 20.0–50.0)

## 2011-12-19 NOTE — Telephone Encounter (Signed)
Spoke with patient and gave him results and recommendation of Dr. Olevia Perches. He wants to know how often he may need to have the IV iron infusion. (He is concerned because he has a $5000 deductible with his insurance) Please, advise.

## 2011-12-19 NOTE — Telephone Encounter (Signed)
Message copied by Hulan Saas on Fri Dec 19, 2011  3:17 PM ------      Message from: [place], [place] M      Created: Fri Dec 19, 2011  1:24 PM       Please call pt with very low iron stores, despite of him taking iron supplements. I am in favor of giving him an Iron infusion of Imfed, 25 mg test dose followe by 1081m Imfed IV infusion over 4 hours.

## 2011-12-19 NOTE — Telephone Encounter (Signed)
Left a message for patient to call me. 

## 2011-12-20 NOTE — Telephone Encounter (Signed)
He needs iron infusion because his oral Iron is not correcting his anemia. His U.Colitis is actve, causing low grade GIB. Going off steroids was good and bad because it allowed his colitis to be more active. I don't know how often he will need an Iron infusion. We will have to see how he responds to the infusion. Sometimes IV Iron may have to be given every 6 months. DB

## 2011-12-22 NOTE — Telephone Encounter (Signed)
Pt aware of Dr. Nichola Sizer recommendations and states he will think about things and call back.

## 2011-12-31 NOTE — Telephone Encounter (Signed)
Labs in EPIC. Note to remind patient

## 2011-12-31 NOTE — Telephone Encounter (Signed)
Addended by: Hulan Saas on: 12/31/2011 02:28 PM   Modules accepted: Orders

## 2011-12-31 NOTE — Telephone Encounter (Signed)
Spoke with patient. He does not think he took his iron orally as consistently as he should. He wants to try taking 2 tablets TID and see if this increases his iron level. He will try this until he finishes this bottle of iron and then he would like to have labs rechecked.

## 2011-12-31 NOTE — Telephone Encounter (Signed)
OK, he can do that, please repeat CBC and Iron levels in 6 weeks

## 2012-01-30 ENCOUNTER — Other Ambulatory Visit: Payer: Self-pay | Admitting: Internal Medicine

## 2012-02-02 ENCOUNTER — Other Ambulatory Visit: Payer: Self-pay | Admitting: Internal Medicine

## 2012-02-02 ENCOUNTER — Telehealth: Payer: Self-pay | Admitting: Internal Medicine

## 2012-02-02 MED ORDER — DICYCLOMINE HCL 20 MG PO TABS: 20.0000 mg | ORAL_TABLET | Freq: Two times a day (BID) | ORAL | Status: DC

## 2012-02-02 MED ORDER — METHOTREXATE 2.5 MG PO TABS: ORAL_TABLET | ORAL | Status: DC

## 2012-02-02 NOTE — Telephone Encounter (Signed)
Patient has been sent refills of his medications. He is actually not due for labs until 6 weeks from now.

## 2012-02-13 ENCOUNTER — Telehealth: Payer: Self-pay | Admitting: *Deleted

## 2012-02-13 NOTE — Telephone Encounter (Signed)
Message copied by Hulan Saas on Fri Feb 13, 2012  8:17 AM ------      Message from: Larina Bras      Created: Fri Feb 06, 2012  5:19 PM                   ----- Message -----         From: Hulan Saas, RN         Sent: 02/06/2012           To: Hulan Saas, RN            Call and remind patient due for CBC and IBC panel.(DB)

## 2012-02-13 NOTE — Telephone Encounter (Signed)
Left a message for patient that he is due for repeat labs.

## 2012-02-16 ENCOUNTER — Other Ambulatory Visit: Payer: Self-pay | Admitting: *Deleted

## 2012-02-16 MED ORDER — ADALIMUMAB 40 MG/0.8ML ~~LOC~~ KIT: PACK | SUBCUTANEOUS | Status: DC

## 2012-02-19 ENCOUNTER — Telehealth: Payer: Self-pay | Admitting: Internal Medicine

## 2012-02-19 ENCOUNTER — Encounter: Payer: Self-pay | Admitting: General Practice

## 2012-02-19 NOTE — Telephone Encounter (Signed)
Error

## 2012-02-24 ENCOUNTER — Other Ambulatory Visit (INDEPENDENT_AMBULATORY_CARE_PROVIDER_SITE_OTHER): Payer: BC Managed Care – PPO

## 2012-02-24 ENCOUNTER — Other Ambulatory Visit: Payer: Self-pay | Admitting: Internal Medicine

## 2012-02-24 ENCOUNTER — Telehealth: Payer: Self-pay | Admitting: Internal Medicine

## 2012-02-24 DIAGNOSIS — D509 Iron deficiency anemia, unspecified: Secondary | ICD-10-CM

## 2012-02-24 LAB — CBC WITH DIFFERENTIAL/PLATELET
Basophils Absolute: 0.1 10*3/uL (ref 0.0–0.1)
Basophils Relative: 0.6 % (ref 0.0–3.0)
Eosinophils Absolute: 0.3 10*3/uL (ref 0.0–0.7)
HCT: 40.2 % (ref 39.0–52.0)
Hemoglobin: 13 g/dL (ref 13.0–17.0)
Lymphocytes Relative: 15.7 % (ref 12.0–46.0)
Lymphs Abs: 1.4 10*3/uL (ref 0.7–4.0)
MCHC: 32.4 g/dL (ref 30.0–36.0)
MCV: 81.7 fl (ref 78.0–100.0)
Neutro Abs: 6.3 10*3/uL (ref 1.4–7.7)
RBC: 4.92 Mil/uL (ref 4.22–5.81)
RDW: 18.8 % — ABNORMAL HIGH (ref 11.5–14.6)

## 2012-02-24 LAB — IBC PANEL: Saturation Ratios: 7.5 % — ABNORMAL LOW (ref 20.0–50.0)

## 2012-02-24 NOTE — Telephone Encounter (Signed)
I sent rx for [name] on 02/16/12 and again this morning.

## 2012-02-24 NOTE — Telephone Encounter (Signed)
I have faxed the information twice (one manual fax and one electronic script) and have now given Marylyn Ishihara with [facility] a verbal order for patient to have [name] once weekly #4 with 2 refills. I have also advised that he will need an office visit for further refills. I have also advised patient of this.

## 2012-02-25 NOTE — Telephone Encounter (Signed)
Pt came for labs.

## 2012-02-26 ENCOUNTER — Telehealth: Payer: Self-pay | Admitting: *Deleted

## 2012-02-26 ENCOUNTER — Other Ambulatory Visit: Payer: Self-pay | Admitting: *Deleted

## 2012-02-26 DIAGNOSIS — D509 Iron deficiency anemia, unspecified: Secondary | ICD-10-CM

## 2012-02-26 NOTE — Telephone Encounter (Signed)
Patient is concerned that his iron only went from 25 to 27. He wants to know if Dr. Olevia Perches thinks this is sufficient. Please, advise.

## 2012-02-26 NOTE — Telephone Encounter (Signed)
I have already answered that question

## 2012-02-27 NOTE — Telephone Encounter (Signed)
Left a message for patient to call me. 

## 2012-02-27 NOTE — Telephone Encounter (Signed)
Notes Recorded by Lafayette Dragon, MD on 02/26/2012 at 5:00 PM No. It is not sufficient but he does not want Iron infusion so the only thing we can do is to continue oral iron. Is he willing to get an Imfed infusion??

## 2012-03-01 NOTE — Telephone Encounter (Signed)
Spoke with patient and gave him Dr. Nichola Sizer recommendation. He wants to think about it for a day and then he will call us with an answer.

## 2012-03-26 ENCOUNTER — Other Ambulatory Visit: Payer: Self-pay | Admitting: Internal Medicine

## 2012-04-10 ENCOUNTER — Other Ambulatory Visit: Payer: Self-pay | Admitting: Internal Medicine

## 2012-04-12 ENCOUNTER — Telehealth: Payer: Self-pay | Admitting: Internal Medicine

## 2012-04-12 MED ORDER — DICYCLOMINE HCL 20 MG PO TABS: 20.0000 mg | ORAL_TABLET | Freq: Two times a day (BID) | ORAL | Status: DC

## 2012-04-12 NOTE — Telephone Encounter (Signed)
RX Sent.

## 2012-04-15 ENCOUNTER — Encounter: Payer: Self-pay | Admitting: *Deleted

## 2012-04-22 ENCOUNTER — Other Ambulatory Visit: Payer: Self-pay | Admitting: Internal Medicine

## 2012-04-30 ENCOUNTER — Ambulatory Visit (INDEPENDENT_AMBULATORY_CARE_PROVIDER_SITE_OTHER): Payer: BC Managed Care – PPO | Admitting: Internal Medicine

## 2012-04-30 ENCOUNTER — Encounter: Payer: Self-pay | Admitting: Internal Medicine

## 2012-04-30 VITALS — BP 110/68 | HR 80 | Ht 68.0 in | Wt 173.0 lb

## 2012-04-30 DIAGNOSIS — K5090 Crohn's disease, unspecified, without complications: Secondary | ICD-10-CM

## 2012-04-30 DIAGNOSIS — K5100 Ulcerative (chronic) pancolitis without complications: Secondary | ICD-10-CM

## 2012-04-30 NOTE — Progress Notes (Signed)
[name] Jul 11, 1961 MRN [id number]   History of Present Illness:  This is a 51 year old white male with ulcerative colitis since 1995 followed until recently by Dr. Cristina Gong. I have been seeing him since May 2011. Patient has been hesitant to undergo a total colectomy as recommended by Dr Cristina Gong for colon  cancer prevention and opted for close medical followup and colonoscopies instead. He is aware of the risks of colon cancer being associated with long-standing active colitis. He has been on multiple medications. He has never been in complete symptomatic remission. He is currently at his best  having 5-7 bowel movements a day. His main symptoms are diarrhea and urgency. He has been on methotrexate 15 mg weekly, [name] 40 mg weekly. He has been off prednisone  for one year. He takes Bentyl 10 mg twice a day and iron supplements 3 times a day. He is on sublingual B12. His IBD markers are positive. Last colonoscopy in May 2012 showed multiple large pseudopolyps in the left colon from 0-40 cm, no evidence of DALM. As per pathology report. Last hemoglobin in March was 13.0.   Past Medical History  Diagnosis Date  . Ulcerative colitis   . Iron deficiency anemia   . Arthritis   . Adenomatous colon polyp    Past Surgical History  Procedure Date  . Knee arthroscopy     right  . Fracture left ankle   . Rotator cuff right shoulder     reports that he quit smoking about 21 years ago. He has never used smokeless tobacco. He reports that he does not drink alcohol or use illicit drugs. family history includes Colitis in his mother.  There is no history of Colon cancer. No Known Allergies      Review of Systems: Denies occasional reflux or dysphagia. Denies chest pain. Level of energy has been good  The remainder of the 10 point ROS is negative except as outlined in H&P   Physical Exam: General appearance  Well developed, in no distress. Eyes- non icteric. HEENT nontraumatic,  normocephalic. Mouth no lesions, tongue papillated, no cheilosis. Neck supple without adenopathy, thyroid not enlarged, no carotid bruits, no JVD. Lungs Clear to auscultation bilaterally. Cor normal S1, normal S2, regular rhythm, no murmur,  quiet precordium. Abdomen: Soft muscular nontender with normoactive bowel sounds. Extremities no pedal edema. Skin no lesions. Neurological alert and oriented x 3. Psychological normal mood and affect.  Assessment and Plan:  Left-sided ulcerative colitis with pseudopolyps and tubular adenoma on last colonoscopy in May 2012. Patient is a stable symptom wise. He still favors  medical followup from consideration of total colectomy. We'll continue on the methotrexate and [name]. We will obtain labs which will include  CBC, iron studies, B12 ,sedimentation rate and metabolic panel. He will be due for recall colonoscopy in 2-3 years from a May 2012; next office visit 6 months   04/30/2012 [name]

## 2012-04-30 NOTE — Patient Instructions (Signed)
Your physician has requested that you go to the basement for the following lab work Monday or Tuesday: CBC, IBC, M03, CMET, Folic Acid, Sed Rate CC: Dr Tami Lin

## 2012-05-05 ENCOUNTER — Other Ambulatory Visit: Payer: Self-pay | Admitting: *Deleted

## 2012-05-05 MED ORDER — ADALIMUMAB 40 MG/0.8ML ~~LOC~~ KIT: PACK | SUBCUTANEOUS | Status: DC

## 2012-05-07 ENCOUNTER — Other Ambulatory Visit (INDEPENDENT_AMBULATORY_CARE_PROVIDER_SITE_OTHER): Payer: BC Managed Care – PPO

## 2012-05-07 ENCOUNTER — Other Ambulatory Visit: Payer: Self-pay | Admitting: *Deleted

## 2012-05-07 DIAGNOSIS — E611 Iron deficiency: Secondary | ICD-10-CM

## 2012-05-07 DIAGNOSIS — K5090 Crohn's disease, unspecified, without complications: Secondary | ICD-10-CM

## 2012-05-07 LAB — COMPREHENSIVE METABOLIC PANEL
ALT: 12 U/L (ref 0–53)
AST: 11 U/L (ref 0–37)
Albumin: 3.4 g/dL — ABNORMAL LOW (ref 3.5–5.2)
Alkaline Phosphatase: 78 U/L (ref 39–117)
Calcium: 8.6 mg/dL (ref 8.4–10.5)
Chloride: 105 mEq/L (ref 96–112)
Potassium: 3.9 mEq/L (ref 3.5–5.1)
Sodium: 139 mEq/L (ref 135–145)
Total Protein: 6.8 g/dL (ref 6.0–8.3)

## 2012-05-07 LAB — CBC WITH DIFFERENTIAL/PLATELET
Basophils Relative: 0.6 % (ref 0.0–3.0)
Eosinophils Absolute: 0.3 10*3/uL (ref 0.0–0.7)
Eosinophils Relative: 2.8 % (ref 0.0–5.0)
Hemoglobin: 13.8 g/dL (ref 13.0–17.0)
Lymphocytes Relative: 12.4 % (ref 12.0–46.0)
MCHC: 32.7 g/dL (ref 30.0–36.0)
Neutro Abs: 7.3 10*3/uL (ref 1.4–7.7)
Neutrophils Relative %: 74.7 % (ref 43.0–77.0)
RBC: 5 Mil/uL (ref 4.22–5.81)
WBC: 9.7 10*3/uL (ref 4.5–10.5)

## 2012-05-07 LAB — IBC PANEL
Saturation Ratios: 13.4 % — ABNORMAL LOW (ref 20.0–50.0)
Transferrin: 245.5 mg/dL (ref 212.0–360.0)

## 2012-05-07 LAB — SEDIMENTATION RATE: Sed Rate: 24 mm/hr — ABNORMAL HIGH (ref 0–22)

## 2012-05-07 LAB — VITAMIN B12: Vitamin B-12: >1500 pg/mL — ABNORMAL HIGH (ref 211–911)

## 2012-05-16 ENCOUNTER — Other Ambulatory Visit: Payer: Self-pay | Admitting: Internal Medicine

## 2012-05-21 ENCOUNTER — Other Ambulatory Visit: Payer: Self-pay | Admitting: Internal Medicine

## 2012-06-29 ENCOUNTER — Ambulatory Visit (INDEPENDENT_AMBULATORY_CARE_PROVIDER_SITE_OTHER): Payer: BC Managed Care – PPO | Admitting: Physician Assistant

## 2012-06-29 VITALS — BP 112/78 | HR 60 | Temp 98.1°F | Resp 16 | Ht 69.38 in | Wt 173.8 lb

## 2012-06-29 DIAGNOSIS — Z0000 Encounter for general adult medical examination without abnormal findings: Secondary | ICD-10-CM

## 2012-06-29 NOTE — Progress Notes (Signed)
Patient ID: [name] MRN: [id number], DOB: Mar 14, 1961 51 y.o. Date of Encounter: 06/29/2012, 2:46 PM  Primary Physician: Leandrew Koyanagi, MD  Chief Complaint: DOT Physical   HPI: 51 y.o. y/o male with history noted below here for DOT physical. Doing well. No issues/complaints. History of ulcerative colitis. Well controlled. No flares. Followed by Dr. Olevia Perches. Recent blood work with Hgb of 13.8 in May 2013. No issues with chalazion or asthma. 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   Review of Systems: Consitutional: No fever, chills, fatigue, night sweats, lymphadenopathy, or weight changes. Eyes: No visual changes, eye redness, or discharge. ENT/Mouth: Ears: No otalgia, tinnitus, hearing loss, discharge. Nose: No congestion, rhinorrhea, sinus pain, or epistaxis. Throat: No sore throat, post nasal drip, or teeth pain. Cardiovascular: No CP, palpitations, diaphoresis, DOE, edema, orthopnea, PND. Respiratory: No cough, hemoptysis, SOB, or wheezing. Gastrointestinal: No anorexia, dysphagia, reflux, pain, nausea, vomiting, hematemesis, diarrhea, constipation, BRBPR, or melena. Genitourinary: No dysuria, frequency, urgency, hematuria, incontinence, nocturia, decreased urinary stream, discharge, impotence, or testicular pain/masses. Musculoskeletal: No decreased ROM, myalgias, stiffness, joint swelling, or weakness. Skin: No rash, erythema, lesion changes, pain, warmth, jaundice, or pruritis. Neurological: No headache, dizziness, syncope, seizures, tremors, memory loss, coordination problems, or paresthesias. Psychological: No anxiety, depression, hallucinations, SI/HI. Endocrine: No fatigue, polydipsia, polyphagia, polyuria, or known diabetes. All other systems were reviewed and are otherwise negative.  Past Medical History  Diagnosis Date  . Ulcerative colitis   . Iron deficiency anemia   . Arthritis   . Adenomatous colon polyp      Past Surgical History  Procedure Date  . Knee  arthroscopy     right  . Fracture left ankle   . Rotator cuff right shoulder     Home Meds:  Prior to Admission medications   Medication Sig Start Date End Date Taking? Authorizing Provider  adalimumab ([name]) 40 MG/0.8ML injection Inject 40 mg (1 pen) SQ once every week. PHARMACY-PLEASE GIVE PENS! 05/05/12  Yes Lafayette Dragon, MD    Allergies: No Known Allergies  History   Social History  . Marital Status: Married    Spouse Name: N/A    Number of Children: N/A  . Years of Education: N/A   Occupational History  . Not on file.   Social History Main Topics  . Smoking status: Former Smoker    Quit date: 04/22/1991  . Smokeless tobacco: Never Used  . Alcohol Use: No  . Drug Use: No  . Sexually Active: Not on file   Other Topics Concern  . Not on file   Social History Narrative  . No narrative on file    Family History  Problem Relation Age of Onset  . Colitis Mother   . Colon cancer Neg Hx     Physical Exam: Blood pressure 112/78, pulse 60, temperature 98.1 F (36.7 C), temperature source Oral, resp. rate 16, height 5' 9.38" (1.762 m), weight 173 lb 12.8 oz (78.835 kg), SpO2 98.00%.  General: Well developed, well nourished, in no acute distress. HEENT: Normocephalic, atraumatic. Conjunctiva pink, sclera non-icteric. Pupils 2 mm constricting to 1 mm, round, regular, and equally reactive to light and accomodation. EOMI. Internal auditory canal clear. TMs with good cone of light and without pathology. Nasal mucosa pink. Nares are without discharge. No sinus tenderness. Oral mucosa pink. Dentition normal. Pharynx without exudate.   Neck: Supple. Trachea midline. No thyromegaly. Full ROM. No lymphadenopathy. Lungs: Clear to auscultation bilaterally without wheezes, rales, or rhonchi. Breathing is of normal effort  and unlabored. Cardiovascular: RRR with S1 S2. No murmurs, rubs, or gallops appreciated. Distal pulses 2+ symmetrically. No carotid or abdominal bruits. Abdomen:  Soft, non-tender, non-distended with normoactive bowel sounds. No hepatosplenomegaly or masses. No rebound/guarding. No CVA tenderness. Without hernias.  Genitourinary: Circumcised male. No penile lesions. Testes descended bilaterally, and smooth without tenderness or masses.  Musculoskeletal: Full range of motion and 5/5 strength throughout. Without swelling, atrophy, tenderness, crepitus, or warmth. Extremities without clubbing, cyanosis, or edema. Calves supple. Skin: Warm and moist without erythema, ecchymosis, wounds, or rash. Neuro: A+Ox3. CN II-XII grossly intact. Moves all extremities spontaneously. Full sensation throughout. Normal gait. DTR 2+ throughout upper and lower extremities. Finger to nose intact. Psych:  Responds to questions appropriately with a normal affect.    Assessment/Plan:  51 y.o. y/o male here for DOT physical with ulcerative colitis -Cleared -Two year card issued -Form completed -RTC prn -Ulcerative colitis well controlled, continue meds per Dr. Olevia Perches  Signed, Christell Faith, PA-C 06/29/2012 2:46 PM

## 2012-08-05 ENCOUNTER — Telehealth: Payer: Self-pay | Admitting: *Deleted

## 2012-08-05 NOTE — Telephone Encounter (Signed)
Message copied by Hulan Saas on Thu Aug 05, 2012  9:50 AM ------      Message from: Hulan Saas      Created: Fri May 07, 2012  1:29 PM       Call and remind patient due CBC, Iron on 8/26- DB

## 2012-08-05 NOTE — Telephone Encounter (Signed)
Also needs TB skin test. Left a message for patient to call me.

## 2012-08-09 NOTE — Telephone Encounter (Signed)
Spoke with patient and he states he will call back to schedule the labs and TB skin test after he talks with his insurance company.

## 2012-08-09 NOTE — Telephone Encounter (Signed)
Left a message for patient to call me. 

## 2012-08-18 ENCOUNTER — Ambulatory Visit: Payer: Self-pay | Admitting: Internal Medicine

## 2012-08-18 DIAGNOSIS — K5190 Ulcerative colitis, unspecified, without complications: Secondary | ICD-10-CM | POA: Insufficient documentation

## 2012-08-23 ENCOUNTER — Other Ambulatory Visit: Payer: Self-pay | Admitting: Internal Medicine

## 2012-08-23 NOTE — Telephone Encounter (Signed)
Message copied by Hulan Saas on Mon Aug 23, 2012  2:36 PM ------      Message from: Hulan Saas      Created: Wed Aug 18, 2012  2:30 PM       Did patient get labs for DB

## 2012-08-23 NOTE — Telephone Encounter (Signed)
Spoke with patient and he will come by this week and have labs done.

## 2012-08-26 ENCOUNTER — Other Ambulatory Visit (INDEPENDENT_AMBULATORY_CARE_PROVIDER_SITE_OTHER): Payer: BC Managed Care – PPO

## 2012-08-26 DIAGNOSIS — E611 Iron deficiency: Secondary | ICD-10-CM

## 2012-08-26 DIAGNOSIS — D509 Iron deficiency anemia, unspecified: Secondary | ICD-10-CM

## 2012-08-26 LAB — CBC WITH DIFFERENTIAL/PLATELET
Basophils Relative: 0.8 % (ref 0.0–3.0)
Eosinophils Relative: 2.6 % (ref 0.0–5.0)
HCT: 39.5 % (ref 39.0–52.0)
Hemoglobin: 13 g/dL (ref 13.0–17.0)
Lymphs Abs: 1.5 10*3/uL (ref 0.7–4.0)
Monocytes Relative: 9.2 % (ref 3.0–12.0)
Platelets: 396 10*3/uL (ref 150.0–400.0)
RBC: 4.6 Mil/uL (ref 4.22–5.81)
WBC: 9.4 10*3/uL (ref 4.5–10.5)

## 2012-08-26 LAB — IBC PANEL: Iron: 29 ug/dL — ABNORMAL LOW (ref 42–165)

## 2012-09-13 ENCOUNTER — Other Ambulatory Visit: Payer: Self-pay | Admitting: Internal Medicine

## 2012-09-13 NOTE — Telephone Encounter (Signed)
rx sent

## 2012-09-22 ENCOUNTER — Other Ambulatory Visit: Payer: Self-pay | Admitting: *Deleted

## 2012-09-22 MED ORDER — ADALIMUMAB 40 MG/0.8ML ~~LOC~~ KIT: PACK | SUBCUTANEOUS | Status: DC

## 2012-09-24 ENCOUNTER — Telehealth: Payer: Self-pay | Admitting: Internal Medicine

## 2012-09-24 ENCOUNTER — Other Ambulatory Visit (INDEPENDENT_AMBULATORY_CARE_PROVIDER_SITE_OTHER): Payer: BC Managed Care – PPO

## 2012-09-24 DIAGNOSIS — D509 Iron deficiency anemia, unspecified: Secondary | ICD-10-CM

## 2012-09-24 DIAGNOSIS — K5120 Ulcerative (chronic) proctitis without complications: Secondary | ICD-10-CM

## 2012-09-24 LAB — COMPREHENSIVE METABOLIC PANEL
AST: 13 U/L (ref 0–37)
Albumin: 3.1 g/dL — ABNORMAL LOW (ref 3.5–5.2)
Alkaline Phosphatase: 80 U/L (ref 39–117)
BUN: 16 mg/dL (ref 6–23)
Potassium: 4.1 mEq/L (ref 3.5–5.1)
Sodium: 137 mEq/L (ref 135–145)
Total Bilirubin: 0.4 mg/dL (ref 0.3–1.2)

## 2012-09-24 LAB — CBC WITH DIFFERENTIAL/PLATELET
Basophils Relative: 0.8 % (ref 0.0–3.0)
Eosinophils Absolute: 0.3 10*3/uL (ref 0.0–0.7)
MCHC: 32.5 g/dL (ref 30.0–36.0)
MCV: 86.4 fl (ref 78.0–100.0)
Monocytes Absolute: 0.8 10*3/uL (ref 0.1–1.0)
Neutrophils Relative %: 65.1 % (ref 43.0–77.0)
Platelets: 468 10*3/uL — ABNORMAL HIGH (ref 150.0–400.0)

## 2012-09-24 LAB — TSH: TSH: 0.9 u[IU]/mL (ref 0.35–5.50)

## 2012-09-24 NOTE — Telephone Encounter (Signed)
Patient came into office and states he thinks he is dehydrated. He states for the last few days, he has felt sluggish in the morning and cannot seem to get going. Denies increase in bleeding, fever, nausea, vomiting or abdominal pain. He does report diarrhea 3-4 times/day and a couple normal stools per day but states this is normal for him. He states he has lost about 6 lbs recently. He is taking [name] weekly and Methotrexate 6 pills on Friday. Dr. Olevia Perches aware. Orders in for CBC, CMET, sed rate, TSH. Dr. Olevia Perches will call patient today when she gets results. He asked that she call him at his cell number. Patient sent to lab.

## 2012-09-26 NOTE — Telephone Encounter (Signed)
I have discussed results with the patient

## 2012-10-04 ENCOUNTER — Telehealth: Payer: Self-pay | Admitting: Internal Medicine

## 2012-10-05 ENCOUNTER — Telehealth: Payer: Self-pay | Admitting: Internal Medicine

## 2012-10-05 ENCOUNTER — Other Ambulatory Visit: Payer: Self-pay | Admitting: Internal Medicine

## 2012-10-05 NOTE — Telephone Encounter (Signed)
This has already been completed.

## 2012-10-05 NOTE — Telephone Encounter (Signed)
rx was sent on 09/22/12. Rx resent today and patient advised. Also advised patient he is due for an office visit in Nov.

## 2012-11-03 ENCOUNTER — Telehealth: Payer: Self-pay | Admitting: Internal Medicine

## 2012-11-03 NOTE — Telephone Encounter (Signed)
Patient calling because his insurance changes in November. He will need his December [name] doses and would cost him $4700 out of pocket. Insurance will pay $500 only. He is asking if he can get samples for December.  Called [name] rep Larene Beach [phone]) and left a message for him to call me.

## 2012-11-04 NOTE — Telephone Encounter (Signed)
Patient notified of date [name] samples are expected.

## 2012-11-04 NOTE — Telephone Encounter (Signed)
Per Carla Drape, samples to come on 12/3 or 12/4. Left a message for patient to call me.

## 2012-11-17 NOTE — Progress Notes (Signed)
Patient ID: [name], male   DOB: 24-Nov-1961, 51 y.o.   MRN: [id number] [name] Pen kit samples given to patient.  Lot #[id number], exp 01/2014, # 4 pens.  Patient was very Patent attorney.

## 2012-11-25 ENCOUNTER — Other Ambulatory Visit: Payer: Self-pay | Admitting: *Deleted

## 2012-11-25 MED ORDER — METHOTREXATE 2.5 MG PO TABS: ORAL_TABLET | ORAL | Status: DC

## 2012-12-03 ENCOUNTER — Other Ambulatory Visit: Payer: Self-pay | Admitting: *Deleted

## 2012-12-03 MED ORDER — DICYCLOMINE HCL 20 MG PO TABS: 20.0000 mg | ORAL_TABLET | Freq: Two times a day (BID) | ORAL | Status: DC

## 2012-12-29 ENCOUNTER — Encounter: Payer: Self-pay | Admitting: Internal Medicine

## 2012-12-29 ENCOUNTER — Ambulatory Visit (INDEPENDENT_AMBULATORY_CARE_PROVIDER_SITE_OTHER): Payer: BC Managed Care – PPO | Admitting: Internal Medicine

## 2012-12-29 VITALS — BP 112/60 | HR 88 | Ht 68.0 in | Wt 168.0 lb

## 2012-12-29 DIAGNOSIS — D849 Immunodeficiency, unspecified: Secondary | ICD-10-CM

## 2012-12-29 DIAGNOSIS — K5100 Ulcerative (chronic) pancolitis without complications: Secondary | ICD-10-CM

## 2012-12-29 DIAGNOSIS — K5090 Crohn's disease, unspecified, without complications: Secondary | ICD-10-CM

## 2012-12-29 MED ORDER — DICYCLOMINE HCL 20 MG PO TABS: 20.0000 mg | ORAL_TABLET | Freq: Two times a day (BID) | ORAL | Status: DC

## 2012-12-29 MED ORDER — METHOTREXATE 2.5 MG PO TABS: ORAL_TABLET | ORAL | Status: DC

## 2012-12-29 NOTE — Progress Notes (Signed)
[name] [birth year] MRN [id number]    History of Present Illness:  This is a 52 year old white male with ulcerative colitis since 1995 followed by Dr.Buccini until 2011 then he switched because he decided against a total colectomy recommended by Dr. Cristina Gong. He has been aware of the risks of colorectal cancer but he has chosen to wait with surgical intervention. He completed his prednisone taper about 2 years ago and has been on [name] 40 mg weekly and methotrexate 15 mg weekly since. He is also on iron supplements and B12. His IBD markers are positive. His last colonoscopy in May 2012 showed pseudopolyps and a tubular adenoma. 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 His last hemoglobin in October 2013 was12.4, hematocrit was 38.3 and was Albumin 3.1 with sedimentation rate of 48. He sees occasional small amounts of blood. He denies abdominal pain. His level of energy has been satisfactory. He ran out of [name] one month ago but we were able to give him some free samples. He is still in need of 2 more [name] pens until he can afford it in February when he will start enrollement in Obamacare.    Past Medical History  Diagnosis Date  . Ulcerative colitis   . Iron deficiency anemia   . Arthritis   . Adenomatous colon polyp    Past Surgical History  Procedure Date  . Knee arthroscopy     right  . Fracture left ankle   . Rotator cuff right shoulder     reports that he quit smoking about 21 years ago. He has never used smokeless tobacco. He reports that he does not drink alcohol or use illicit drugs. family history includes Colitis in his mother.  There is no history of Colon cancer. No Known Allergies      Review of Systems:Denies fever dysphagia heartburn  The remainder of the 10 point ROS is negative except as outlined in H&P   Physical Exam: General appearance  Well developed, in no distress. Eyes- non icteric. HEENT  nontraumatic, normocephalic. Mouth no lesions, tongue papillated, no cheilosis. Neck supple without adenopathy, thyroid not enlarged, no carotid bruits, no JVD. Lungs Clear to auscultation bilaterally. Cor normal S1, normal S2, regular rhythm, no murmur,  quiet precordium. Abdomen: Soft relaxed abdomen with normal active bowel sounds. No distention.  Rectal:External hemorrhoidal tags. Normal rectal sphincter tone. Brown strongly Hemoccult-positive stool.  Extremities no pedal edema. Skin no lesions. Neurological alert and oriented x 3. Psychological normal mood and affect.  Assessment and Plan:  Problem #1 Ulcerative pancolitis since 1995. Patient chooses to continue medical treatment as opposed to surgical colectomy. He is up-to-date on his colonoscopy. He needs refills on his biologicals as well as methotrexate and Bentyl. He will have his lab work done the first week in February 2014 when he will have insurance coverage. I will see him in 6 months. Recall colonoscopy before May 2015.  Problem #2- anemia. Continue Iron till CBC available  Problem #3 immunosupression - long term, continue [name]   12/29/2012 [name]

## 2012-12-29 NOTE — Patient Instructions (Addendum)
Your physician has requested that you go to the basement for the following lab work 01/17/13: CBC, IBC, B12, Sed Rate  We have sent the following medications to your pharmacy for you to pick up at your convenience: Methorexate, Bentyl  We have contacted the representative for [name] and are awaiting a call back to try to get 2 more [name] samples for you. We will keep in contact with you to keep you up to date.  CC: Dr Tami Lin

## 2013-01-07 ENCOUNTER — Other Ambulatory Visit: Payer: BC Managed Care – PPO

## 2013-01-17 ENCOUNTER — Telehealth: Payer: Self-pay | Admitting: Internal Medicine

## 2013-01-17 NOTE — Telephone Encounter (Signed)
Advised patient that I have completed [employer] prior authorization form and have marked it "urgent." I have advised him that I will make him aware when I hear a decision from insurance regarding his [name]. Patient verbalizes understanding.

## 2013-01-31 ENCOUNTER — Other Ambulatory Visit (INDEPENDENT_AMBULATORY_CARE_PROVIDER_SITE_OTHER): Payer: BC Managed Care – PPO

## 2013-01-31 DIAGNOSIS — K5090 Crohn's disease, unspecified, without complications: Secondary | ICD-10-CM

## 2013-01-31 LAB — CBC WITH DIFFERENTIAL/PLATELET
Basophils Absolute: 0.1 10*3/uL (ref 0.0–0.1)
Basophils Relative: 0.8 % (ref 0.0–3.0)
Eosinophils Absolute: 0.3 10*3/uL (ref 0.0–0.7)
Lymphocytes Relative: 18.1 % (ref 12.0–46.0)
MCHC: 32.1 g/dL (ref 30.0–36.0)
MCV: 79.8 fl (ref 78.0–100.0)
Monocytes Absolute: 0.7 10*3/uL (ref 0.1–1.0)
Neutrophils Relative %: 69.3 % (ref 43.0–77.0)
Platelets: 422 10*3/uL — ABNORMAL HIGH (ref 150.0–400.0)
RBC: 4.86 Mil/uL (ref 4.22–5.81)
RDW: 17.8 % — ABNORMAL HIGH (ref 11.5–14.6)

## 2013-01-31 LAB — IBC PANEL
Iron: 24 ug/dL — ABNORMAL LOW (ref 42–165)
Saturation Ratios: 6.2 % — ABNORMAL LOW (ref 20.0–50.0)
Transferrin: 277.3 mg/dL (ref 212.0–360.0)

## 2013-02-08 ENCOUNTER — Other Ambulatory Visit: Payer: Self-pay | Admitting: *Deleted

## 2013-03-08 ENCOUNTER — Other Ambulatory Visit: Payer: Self-pay | Admitting: Internal Medicine

## 2013-03-17 ENCOUNTER — Other Ambulatory Visit: Payer: Self-pay | Admitting: *Deleted

## 2013-03-17 MED ORDER — DICYCLOMINE HCL 20 MG PO TABS: 20.0000 mg | ORAL_TABLET | Freq: Two times a day (BID) | ORAL | Status: DC

## 2013-03-17 MED ORDER — METHOTREXATE 2.5 MG PO TABS: ORAL_TABLET | ORAL | Status: DC

## 2013-04-05 ENCOUNTER — Telehealth: Payer: Self-pay | Admitting: *Deleted

## 2013-04-05 NOTE — Telephone Encounter (Signed)
Spoke with patient and he will come for labs this week.

## 2013-04-08 ENCOUNTER — Other Ambulatory Visit (INDEPENDENT_AMBULATORY_CARE_PROVIDER_SITE_OTHER): Payer: BC Managed Care – PPO

## 2013-04-08 DIAGNOSIS — D509 Iron deficiency anemia, unspecified: Secondary | ICD-10-CM

## 2013-04-08 LAB — IBC PANEL: Transferrin: 259.8 mg/dL (ref 212.0–360.0)

## 2013-04-08 LAB — CBC WITH DIFFERENTIAL/PLATELET
Basophils Absolute: 0.1 10*3/uL (ref 0.0–0.1)
Basophils Relative: 0.8 % (ref 0.0–3.0)
Eosinophils Absolute: 0.3 10*3/uL (ref 0.0–0.7)
MCHC: 32.5 g/dL (ref 30.0–36.0)
MCV: 81.2 fl (ref 78.0–100.0)
Monocytes Absolute: 1.2 10*3/uL — ABNORMAL HIGH (ref 0.1–1.0)
Neutrophils Relative %: 59.7 % (ref 43.0–77.0)
RBC: 4.75 Mil/uL (ref 4.22–5.81)
RDW: 18.5 % — ABNORMAL HIGH (ref 11.5–14.6)

## 2013-04-11 ENCOUNTER — Other Ambulatory Visit: Payer: Self-pay | Admitting: *Deleted

## 2013-04-11 DIAGNOSIS — D508 Other iron deficiency anemias: Secondary | ICD-10-CM

## 2013-04-25 ENCOUNTER — Encounter (HOSPITAL_COMMUNITY)
Admission: RE | Admit: 2013-04-25 | Discharge: 2013-04-25 | Disposition: A | Discharge status: Home / Self Care | Payer: BC Managed Care – PPO | Source: Ambulatory Visit | Attending: Internal Medicine | Admitting: Internal Medicine

## 2013-04-25 ENCOUNTER — Other Ambulatory Visit (HOSPITAL_COMMUNITY): Payer: Self-pay | Admitting: Internal Medicine

## 2013-04-25 ENCOUNTER — Encounter (HOSPITAL_COMMUNITY): Payer: Self-pay

## 2013-04-25 VITALS — BP 120/75 | HR 68 | Temp 97.7°F | Resp 16

## 2013-04-25 DIAGNOSIS — D509 Iron deficiency anemia, unspecified: Secondary | ICD-10-CM | POA: Insufficient documentation

## 2013-04-25 DIAGNOSIS — D508 Other iron deficiency anemias: Secondary | ICD-10-CM

## 2013-04-25 MED ORDER — SODIUM CHLORIDE 0.9 % IV SOLN
Freq: Once | INTRAVENOUS | Status: AC
Start: 2013-04-25 — End: 2013-04-25
  Administered 2013-04-25: 13:00:00 via INTRAVENOUS

## 2013-04-25 MED ORDER — SODIUM CHLORIDE 0.9 % IV SOLN
1020.0000 mg | Freq: Once | INTRAVENOUS | Status: AC
  Administered 2013-04-25: 1020 mg via INTRAVENOUS
  Filled 2013-04-25: qty 34

## 2013-05-19 ENCOUNTER — Other Ambulatory Visit (HOSPITAL_COMMUNITY): Payer: Self-pay | Admitting: Internal Medicine

## 2013-05-19 ENCOUNTER — Telehealth: Payer: Self-pay | Admitting: *Deleted

## 2013-05-19 NOTE — Telephone Encounter (Signed)
Message copied by Hulan Saas on Thu May 19, 2013  8:53 AM ------      Message from: Hulan Saas      Created: Mon Apr 11, 2013  9:44 AM       Call and remind patient due for CBC for DB on 05/23/13 ------

## 2013-05-19 NOTE — Telephone Encounter (Signed)
Patient will come for labs next week.

## 2013-05-19 NOTE — Telephone Encounter (Signed)
Left a message for patient to call me. 

## 2013-05-24 ENCOUNTER — Other Ambulatory Visit: Payer: Self-pay | Admitting: Gastroenterology

## 2013-05-24 MED ORDER — METHOTREXATE 2.5 MG PO TABS: ORAL_TABLET | ORAL | Status: DC

## 2013-05-26 ENCOUNTER — Other Ambulatory Visit: Payer: Self-pay | Admitting: Gastroenterology

## 2013-05-30 ENCOUNTER — Other Ambulatory Visit: Payer: Self-pay | Admitting: *Deleted

## 2013-05-30 MED ORDER — METHOTREXATE 2.5 MG PO TABS: ORAL_TABLET | ORAL | Status: DC

## 2013-05-31 ENCOUNTER — Other Ambulatory Visit (INDEPENDENT_AMBULATORY_CARE_PROVIDER_SITE_OTHER): Payer: BC Managed Care – PPO

## 2013-05-31 DIAGNOSIS — D508 Other iron deficiency anemias: Secondary | ICD-10-CM

## 2013-05-31 LAB — CBC WITH DIFFERENTIAL/PLATELET
Basophils Relative: 0.7 % (ref 0.0–3.0)
Eosinophils Relative: 4.7 % (ref 0.0–5.0)
HCT: 42.1 % (ref 39.0–52.0)
Lymphs Abs: 1.4 10*3/uL (ref 0.7–4.0)
MCV: 85.4 fl (ref 78.0–100.0)
Monocytes Absolute: 0.9 10*3/uL (ref 0.1–1.0)
Monocytes Relative: 11.6 % (ref 3.0–12.0)
Platelets: 279 10*3/uL (ref 150.0–400.0)
RBC: 4.93 Mil/uL (ref 4.22–5.81)
WBC: 7.4 10*3/uL (ref 4.5–10.5)

## 2013-06-01 ENCOUNTER — Other Ambulatory Visit: Payer: Self-pay | Admitting: *Deleted

## 2013-06-01 DIAGNOSIS — D509 Iron deficiency anemia, unspecified: Secondary | ICD-10-CM

## 2013-06-09 ENCOUNTER — Telehealth: Payer: Self-pay | Admitting: Internal Medicine

## 2013-06-09 MED ORDER — ADALIMUMAB 40 MG/0.8ML ~~LOC~~ KIT: PACK | SUBCUTANEOUS | Status: DC

## 2013-06-09 MED ORDER — HUMIRA PEN 40 MG/0.8ML ~~LOC~~ KIT: PACK | SUBCUTANEOUS | Status: DC

## 2013-06-09 NOTE — Telephone Encounter (Signed)
I have spoken to Veyo at [facility] and they will rush the [name] to patient's home address.

## 2013-06-09 NOTE — Telephone Encounter (Signed)
I have spoken to the patient and advised that I am unable to see a denial in our system. I have sent a script today to [facility] and have advised patient of this. I have also contacted [employer]

## 2013-06-16 ENCOUNTER — Other Ambulatory Visit: Payer: Self-pay | Admitting: *Deleted

## 2013-06-16 MED ORDER — DICYCLOMINE HCL 20 MG PO TABS: 20.0000 mg | ORAL_TABLET | Freq: Two times a day (BID) | ORAL | Status: DC

## 2013-07-11 ENCOUNTER — Telehealth: Payer: Self-pay | Admitting: Internal Medicine

## 2013-07-11 MED ORDER — DICYCLOMINE HCL 20 MG PO TABS: 20.0000 mg | ORAL_TABLET | Freq: Two times a day (BID) | ORAL | Status: DC

## 2013-07-11 NOTE — Telephone Encounter (Signed)
rx sent

## 2013-08-09 ENCOUNTER — Encounter: Payer: Self-pay | Admitting: Internal Medicine

## 2013-08-09 ENCOUNTER — Ambulatory Visit (INDEPENDENT_AMBULATORY_CARE_PROVIDER_SITE_OTHER): Payer: BC Managed Care – PPO | Admitting: Internal Medicine

## 2013-08-09 VITALS — BP 100/70 | HR 80 | Ht 68.0 in | Wt 173.0 lb

## 2013-08-09 DIAGNOSIS — K5190 Ulcerative colitis, unspecified, without complications: Secondary | ICD-10-CM

## 2013-08-09 DIAGNOSIS — D508 Other iron deficiency anemias: Secondary | ICD-10-CM

## 2013-08-09 DIAGNOSIS — D849 Immunodeficiency, unspecified: Secondary | ICD-10-CM

## 2013-08-09 DIAGNOSIS — Z23 Encounter for immunization: Secondary | ICD-10-CM

## 2013-08-09 DIAGNOSIS — K5090 Crohn's disease, unspecified, without complications: Secondary | ICD-10-CM

## 2013-08-09 MED ORDER — DICYCLOMINE HCL 20 MG PO TABS: 20.0000 mg | ORAL_TABLET | Freq: Two times a day (BID) | ORAL | Status: DC

## 2013-08-09 MED ORDER — METHOTREXATE 2.5 MG PO TABS: ORAL_TABLET | ORAL | Status: DC

## 2013-08-09 NOTE — Progress Notes (Signed)
[name] 1961/05/02 MRN [id number]   History of Present Illness:  This is a 52 year old white male with  universal ulcerative colitis since 1995. He was followed by Dr.Buccini until 2011. He switched GI care because he declined a total colectomy recommended to avoid the risk of colon cancer. Patient has chronic iron deficiency anemia. He is status post recent iron infusion on 04/25/2013 with complete restoration of his iron stores. Today, he has no specific complaints. He has been off prednisone for almost 2 years. He is now on [name] 40 mg weekly, methotrexate 15 mg weekly, B12 and iron supplements. He has positive IBD markers. His last colonoscopy in May 2012 showed pseudopolyps and tubular adenoma. His last hemoglobin in June 2014 was 14 with a hematocrit of 42.1. He is due for his TB skin test today. He will also need a pneumococcal vaccine and hepatitis A and B serologies. His colonoscopy recall will be due in May 2015.    Past Medical History  Diagnosis Date  . Ulcerative colitis   . Iron deficiency anemia   . Arthritis   . Adenomatous colon polyp    Past Surgical History  Procedure Laterality Date  . Knee arthroscopy      right  . Fracture left ankle    . Rotator cuff right shoulder      reports that he quit smoking about 22 years ago. He has never used smokeless tobacco. He reports that he does not drink alcohol or use illicit drugs. family history includes Colitis in his mother. There is no history of Colon cancer. No Known Allergies      Review of Systems:Denies rectal bleeding abdominal pain. Level of energy he is having good  The remainder of the 10 point ROS is negative except as outlined in H&P   Physical Exam: General appearance  Well developed, in no distress.  Assessment and Plan:  Problem #31 52 year old white male with universal ulcerative colitis currently under good control with [name] 40 mg weekly. We will start tapering his methotrexate by 5 mg every  4 weeks and he will remain on all other medications. He will receive his TB skin test and a Pneumovax today. He declined blood tests today because of high deductible but will have repeat blood tests in 4 weeks which will include CBC, iron studies, sedimentation rate, hepatitis A and B serologies. He will be due for a recall colonoscopy in May 2015.   08/09/2013 [name]

## 2013-08-09 NOTE — Patient Instructions (Addendum)
We have given you a TB skin test today. Please make certain to come back to the office for a reading between 48-72 hours from now to avoid requiring repeat testing.  We have given you a pneumonia (pneumovax) vaccine today. You may experience a small amount of swelling and redness at the injection site. This is normal. Should you experience these symptoms, please apply ice to the injection area for 10-15 minutes every 2-3 hours. However, should these symptoms or any other symptoms related to the injection concern you, please call our office at [phone].  Your physician has requested that you go to the basement for the following lab work on 09/06/13: CBC, IBC, Sed Rate, Hepatitis A and B serologies  Your physician has requested that you go to the basement for the following lab work before leaving today: Methotrexate-Take 4 tablets daily x 1 month, then decrease to 3 tablets daily x 1 month, then decrease to 2 tablets daily x 1 month, then take 1 tablet daily x 1 month, then discontinue.  CC: Dr Tami Lin

## 2013-08-16 ENCOUNTER — Telehealth: Payer: Self-pay | Admitting: Internal Medicine

## 2013-08-16 NOTE — Telephone Encounter (Signed)
[name] spoke with patient.

## 2013-08-18 ENCOUNTER — Other Ambulatory Visit: Payer: Self-pay | Admitting: Internal Medicine

## 2013-08-23 ENCOUNTER — Ambulatory Visit (INDEPENDENT_AMBULATORY_CARE_PROVIDER_SITE_OTHER): Payer: BC Managed Care – PPO | Admitting: Internal Medicine

## 2013-08-23 DIAGNOSIS — K5190 Ulcerative colitis, unspecified, without complications: Secondary | ICD-10-CM

## 2013-08-23 MED ORDER — HUMIRA PEN 40 MG/0.8ML ~~LOC~~ KIT: PACK | SUBCUTANEOUS | Status: DC

## 2013-08-24 ENCOUNTER — Other Ambulatory Visit: Payer: Self-pay | Admitting: Internal Medicine

## 2013-08-24 MED ORDER — HUMIRA PEN 40 MG/0.8ML ~~LOC~~ KIT: PACK | SUBCUTANEOUS | Status: DC

## 2013-08-25 ENCOUNTER — Encounter: Payer: Self-pay | Admitting: *Deleted

## 2013-08-25 ENCOUNTER — Other Ambulatory Visit: Payer: Self-pay | Admitting: Internal Medicine

## 2013-08-25 ENCOUNTER — Ambulatory Visit (INDEPENDENT_AMBULATORY_CARE_PROVIDER_SITE_OTHER)
Admission: RE | Admit: 2013-08-25 | Discharge: 2013-08-25 | Disposition: A | Discharge status: Home / Self Care | Payer: BC Managed Care – PPO | Source: Ambulatory Visit | Attending: Internal Medicine | Admitting: Internal Medicine

## 2013-08-25 DIAGNOSIS — R7611 Nonspecific reaction to tuberculin skin test without active tuberculosis: Secondary | ICD-10-CM

## 2013-08-25 HISTORY — DX: Nonspecific reaction to tuberculin skin test without active tuberculosis: R76.11

## 2013-08-25 LAB — TB SKIN TEST: Induration: 5 mm

## 2013-09-01 ENCOUNTER — Other Ambulatory Visit: Payer: Self-pay | Admitting: Internal Medicine

## 2013-09-14 ENCOUNTER — Telehealth: Payer: Self-pay | Admitting: *Deleted

## 2013-09-14 NOTE — Telephone Encounter (Signed)
Spoke with patient and he will come for labs.

## 2013-09-14 NOTE — Telephone Encounter (Signed)
Message copied by Hulan Saas on Wed Sep 14, 2013 11:18 AM ------      Message from: Hulan Saas      Created: Thu Sep 01, 2013  8:23 AM       Did patient get labs for DB ------

## 2013-09-22 ENCOUNTER — Encounter: Payer: Self-pay | Admitting: *Deleted

## 2013-09-22 ENCOUNTER — Telehealth: Payer: Self-pay | Admitting: *Deleted

## 2013-09-22 NOTE — Telephone Encounter (Signed)
Patient has not had labs drawn. Letter mailed.

## 2013-09-22 NOTE — Telephone Encounter (Signed)
Message copied by Hulan Saas on Thu Sep 22, 2013  3:01 PM ------      Message from: Hulan Saas      Created: Wed Sep 14, 2013 11:20 AM       Did patient have labs for DB ------

## 2013-09-28 ENCOUNTER — Other Ambulatory Visit (INDEPENDENT_AMBULATORY_CARE_PROVIDER_SITE_OTHER): Payer: BC Managed Care – PPO

## 2013-09-28 DIAGNOSIS — D508 Other iron deficiency anemias: Secondary | ICD-10-CM

## 2013-09-28 DIAGNOSIS — K5090 Crohn's disease, unspecified, without complications: Secondary | ICD-10-CM

## 2013-09-28 DIAGNOSIS — K5190 Ulcerative colitis, unspecified, without complications: Secondary | ICD-10-CM

## 2013-09-28 DIAGNOSIS — D509 Iron deficiency anemia, unspecified: Secondary | ICD-10-CM

## 2013-09-28 DIAGNOSIS — D849 Immunodeficiency, unspecified: Secondary | ICD-10-CM

## 2013-09-28 LAB — HEPATITIS A ANTIBODY, TOTAL: Hep A Total Ab: NONREACTIVE

## 2013-09-28 LAB — CBC WITH DIFFERENTIAL/PLATELET
Basophils Relative: 1.1 % (ref 0.0–3.0)
Eosinophils Absolute: 0.4 10*3/uL (ref 0.0–0.7)
Eosinophils Relative: 4 % (ref 0.0–5.0)
HCT: 43 % (ref 39.0–52.0)
Hemoglobin: 14.6 g/dL (ref 13.0–17.0)
Lymphs Abs: 1.4 10*3/uL (ref 0.7–4.0)
MCHC: 34 g/dL (ref 30.0–36.0)
MCV: 87.7 fl (ref 78.0–100.0)
Monocytes Absolute: 0.6 10*3/uL (ref 0.1–1.0)
Neutro Abs: 6.6 10*3/uL (ref 1.4–7.7)
Neutrophils Relative %: 72.5 % (ref 43.0–77.0)
RBC: 4.91 Mil/uL (ref 4.22–5.81)
WBC: 9.1 10*3/uL (ref 4.5–10.5)

## 2013-09-28 LAB — HEPATITIS B SURFACE ANTIGEN: Hepatitis B Surface Ag: NEGATIVE

## 2013-11-09 ENCOUNTER — Other Ambulatory Visit: Payer: Self-pay | Admitting: Internal Medicine

## 2013-11-29 ENCOUNTER — Other Ambulatory Visit: Payer: Self-pay | Admitting: *Deleted

## 2013-11-29 MED ORDER — DICYCLOMINE HCL 20 MG PO TABS: 20.0000 mg | ORAL_TABLET | Freq: Two times a day (BID) | ORAL | Status: DC

## 2013-11-29 NOTE — Telephone Encounter (Signed)
rx sent

## 2014-01-02 ENCOUNTER — Other Ambulatory Visit: Payer: Self-pay | Admitting: *Deleted

## 2014-01-02 MED ORDER — METHOTREXATE 2.5 MG PO TABS: ORAL_TABLET | ORAL | Status: DC

## 2014-02-01 ENCOUNTER — Other Ambulatory Visit: Payer: Self-pay | Admitting: *Deleted

## 2014-02-01 MED ORDER — DICYCLOMINE HCL 20 MG PO TABS: 20.0000 mg | ORAL_TABLET | Freq: Two times a day (BID) | ORAL | Status: DC

## 2014-02-03 ENCOUNTER — Other Ambulatory Visit: Payer: Self-pay | Admitting: Internal Medicine

## 2014-03-27 ENCOUNTER — Other Ambulatory Visit: Payer: Self-pay | Admitting: *Deleted

## 2014-03-27 MED ORDER — METHOTREXATE 2.5 MG PO TABS: ORAL_TABLET | ORAL | Status: DC

## 2014-04-18 ENCOUNTER — Ambulatory Visit (INDEPENDENT_AMBULATORY_CARE_PROVIDER_SITE_OTHER): Payer: BC Managed Care – PPO | Admitting: Family Medicine

## 2014-04-18 VITALS — BP 116/72 | HR 81 | Temp 98.6°F | Resp 16 | Ht 68.0 in | Wt 170.0 lb

## 2014-04-18 DIAGNOSIS — R058 Other specified cough: Secondary | ICD-10-CM

## 2014-04-18 DIAGNOSIS — R05 Cough: Secondary | ICD-10-CM

## 2014-04-18 DIAGNOSIS — H1033 Unspecified acute conjunctivitis, bilateral: Secondary | ICD-10-CM

## 2014-04-18 DIAGNOSIS — H10029 Other mucopurulent conjunctivitis, unspecified eye: Secondary | ICD-10-CM

## 2014-04-18 DIAGNOSIS — M62830 Muscle spasm of back: Secondary | ICD-10-CM

## 2014-04-18 DIAGNOSIS — J309 Allergic rhinitis, unspecified: Secondary | ICD-10-CM

## 2014-04-18 DIAGNOSIS — R059 Cough, unspecified: Secondary | ICD-10-CM

## 2014-04-18 DIAGNOSIS — M5380 Other specified dorsopathies, site unspecified: Secondary | ICD-10-CM

## 2014-04-18 MED ORDER — CETIRIZINE HCL 10 MG PO TABS: 10.0000 mg | ORAL_TABLET | Freq: Every day | ORAL | Status: DC

## 2014-04-18 MED ORDER — FLUTICASONE PROPIONATE 50 MCG/ACT NA SUSP: 2.0000 | Freq: Every day | NASAL | Status: DC

## 2014-04-18 MED ORDER — OFLOXACIN 0.3 % OP SOLN: OPHTHALMIC | Status: DC

## 2014-04-18 MED ORDER — CYCLOBENZAPRINE HCL 10 MG PO TABS: 10.0000 mg | ORAL_TABLET | Freq: Three times a day (TID) | ORAL | Status: DC | PRN

## 2014-04-18 MED ORDER — MELOXICAM 7.5 MG PO TABS: 7.5000 mg | ORAL_TABLET | Freq: Every day | ORAL | Status: DC

## 2014-04-18 MED ORDER — ALBUTEROL SULFATE HFA 108 (90 BASE) MCG/ACT IN AERS: 2.0000 | INHALATION_SPRAY | RESPIRATORY_TRACT | Status: DC | PRN

## 2014-04-18 NOTE — Patient Instructions (Signed)
Bacterial Conjunctivitis Bacterial conjunctivitis, commonly called pink eye, is an inflammation of the clear membrane that covers the white part of the eye (conjunctiva). The inflammation can also happen on the underside of the eyelids. The blood vessels in the conjunctiva become inflamed causing the eye to become red or pink. Bacterial conjunctivitis may spread easily from one eye to another and from person to person (contagious).  CAUSES  Bacterial conjunctivitis is caused by bacteria. The bacteria may come from your own skin, your upper respiratory tract, or from someone else with bacterial conjunctivitis. SYMPTOMS  The normally white color of the eye or the underside of the eyelid is usually pink or red. The pink eye is usually associated with irritation, tearing, and some sensitivity to light. Bacterial conjunctivitis is often associated with a thick, yellowish discharge from the eye. The discharge may turn into a crust on the eyelids overnight, which causes your eyelids to stick together. If a discharge is present, there may also be some blurred vision in the affected eye. DIAGNOSIS  Bacterial conjunctivitis is diagnosed by your caregiver through an eye exam and the symptoms that you report. Your caregiver looks for changes in the surface tissues of your eyes, which may point to the specific type of conjunctivitis. A sample of any discharge may be collected on a cotton-tip swab if you have a severe case of conjunctivitis, if your cornea is affected, or if you keep getting repeat infections that do not respond to treatment. The sample will be sent to a lab to see if the inflammation is caused by a bacterial infection and to see if the infection will respond to antibiotic medicines. TREATMENT   Bacterial conjunctivitis is treated with antibiotics. Antibiotic eyedrops are most often used. However, antibiotic ointments are also available. Antibiotics pills are sometimes used. Artificial tears or eye  washes may ease discomfort. HOME CARE INSTRUCTIONS   To ease discomfort, apply a cool, clean wash cloth to your eye for 10 20 minutes, 3 4 times a day.  Gently wipe away any drainage from your eye with a warm, wet washcloth or a cotton ball.  Wash your hands often with soap and water. Use paper towels to dry your hands.  Do not share towels or wash cloths. This may spread the infection.  Change or wash your pillow case every day.  You should not use eye makeup until the infection is gone.  Do not operate machinery or drive if your vision is blurred.  Stop using contacts lenses. Ask your caregiver how to sterilize or replace your contacts before using them again. This depends on the type of contact lenses that you use.  When applying medicine to the infected eye, do not touch the edge of your eyelid with the eyedrop bottle or ointment tube. SEEK IMMEDIATE MEDICAL CARE IF:   Your infection has not improved within 3 days after beginning treatment.  You had yellow discharge from your eye and it returns.  You have increased eye pain.  Your eye redness is spreading.  Your vision becomes blurred.  You have a fever or persistent symptoms for more than 2 3 days.  You have a fever and your symptoms suddenly get worse.  You have facial pain, redness, or swelling. MAKE SURE YOU:   Understand these instructions.  Will watch your condition.  Will get help right away if you are not doing well or get worse. Document Released: 12/01/2005 Document Revised: 08/25/2012 Document Reviewed: 05/03/2012 [facility] Patient Information 2014 [place], [place].  Muscle Cramps and Spasms Muscle cramps and spasms occur when a muscle or muscles tighten and you have no control over this tightening (involuntary muscle contraction). They are a common problem and can develop in any muscle. The most common place is in the calf muscles of the leg. Both muscle cramps and muscle spasms are involuntary muscle  contractions, but they also have differences:   Muscle cramps are sporadic and painful. They may last a few seconds to a quarter of an hour. Muscle cramps are often more forceful and last longer than muscle spasms.  Muscle spasms may or may not be painful. They may also last just a few seconds or much longer. CAUSES  It is uncommon for cramps or spasms to be due to a serious underlying problem. In many cases, the cause of cramps or spasms is unknown. Some common causes are:   Overexertion.   Overuse from repetitive motions (doing the same thing over and over).   Remaining in a certain position for a long period of time.   Improper preparation, form, or technique while performing a sport or activity.   Dehydration.   Injury.   Side effects of some medicines.   Abnormally low levels of the salts and ions in your blood (electrolytes), especially potassium and calcium. This could happen if you are taking water pills (diuretics) or you are pregnant.  Some underlying medical problems can make it more likely to develop cramps or spasms. These include, but are not limited to:   Diabetes.   Parkinson disease.   Hormone disorders, such as thyroid problems.   Alcohol abuse.   Diseases specific to muscles, joints, and bones.   Blood vessel disease where not enough blood is getting to the muscles.  HOME CARE INSTRUCTIONS   Stay well hydrated. Drink enough water and fluids to keep your urine clear or pale yellow.  It may be helpful to massage, stretch, and relax the affected muscle.  For tight or tense muscles, use a warm towel, heating pad, or hot shower water directed to the affected area.  If you are sore or have pain after a cramp or spasm, applying ice to the affected area may relieve discomfort.  Put ice in a plastic bag.  Place a towel between your skin and the bag.  Leave the ice on for 15-20 minutes, 03-04 times a day.  Medicines used to treat a known  cause of cramps or spasms may help reduce their frequency or severity. Only take over-the-counter or prescription medicines as directed by your caregiver. SEEK MEDICAL CARE IF:  Your cramps or spasms get more severe, more frequent, or do not improve over time.  MAKE SURE YOU:   Understand these instructions.  Will watch your condition.  Will get help right away if you are not doing well or get worse. Document Released: 05/23/2002 Document Revised: 03/28/2013 Document Reviewed: 11/17/2012 [facility] Patient Information 2014 [place], [place].

## 2014-04-18 NOTE — Progress Notes (Signed)
This chart was scribed for Delman Cheadle MD by Allena Earing, ED Scribe. This patient was seen in room 4 and the patient's care was started at 12:53 PM .  Subjective:    Patient ID: [name], male    DOB: [birth year], 53 y.o.   MRN: [id number]   Chief Complaint  Patient presents with  . Eye Problem    both eyes are red x3 days  . Sore Throat    at night only x 3 days    Eye Problem  Associated symptoms include an eye discharge and eye redness. Pertinent negatives include no fever, photophobia or weakness.  Sore Throat  Associated symptoms include congestion, coughing, ear pain and trouble swallowing (has since resolved). Pertinent negatives include no neck pain or shortness of breath.    HPI Comments: [name] is a 53 y.o. male who presents to [facility] complaining of discharge from his eyes x 3d. Wakes in the morning crusted over - initially just left eye then started in Rt yesterday. Having green purulent d/c bilaterally. Pt reports that occ he will get a "film of puss" passing over his eye which will temporarily blur his vision but no other vision changes.  Eyes are red He states there is associated pain, burning, and itching. He has tried saline eye wash, visine, and some silver eye gtts (natural antibacterial recommended by naturopathic provider) and other remedies to no effect.   Pt also reports a recurrent cough. Cough triggers right back pain.  Gets the cough yearly in spring - occ needs in office neb treatment if severe but not that bad yet. Would like to restart on alb inhaler which has helped a lot prev. No SHoB or wheezing. No CP or palps. Is having rhinitis w/ sinus pressure and PND.  Pt did take one benadryl last night, reports no effect.  Pt c/o pain "behind his right shoulder blades", worsened by cough and deep breathing. Also aggravated by movement, twisting. He denies CP or radiation of the pain. He also denies any injury that could cause his  back any pain.  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  Has noticed that it hurts badly when he is in a tree leaning to his right and using a chain saw with his extended right arm - which he does a lot. Pt did try 10 mg of Flexeril which he had at home from prior and provided some relief.   Pt also reports a sore throat that has been present for 3 days. He states that when his sore throat began, he could hardly swallow but is now improving.  Pt is immunosupressed due to medications he takes for Ulcerative Colitis.     Past Medical History  Diagnosis Date  . Ulcerative colitis   . Iron deficiency anemia   . Arthritis   . Adenomatous colon polyp   . 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 dicyclomine (BENTYL) 20 MG tablet Take 1 tablet (20 mg total) by mouth 2 (two) times daily.  60 tablet  2  . [name] PEN 40 MG/0.8ML injection INJECT THE CONTENTS OF ONE PEN (40MG) SUBCUTANEOUSLY Once per week. SINGLE USE PEN. KEEP REFRIGERATED. DO NOT SHAKE. PROTECT FROM LIGHT.  12 each  0  . methotrexate (RHEUMATREX) 2.5 MG tablet Take as directed  24 tablet  1   No current facility-administered medications  on file prior to visit.    No Known Allergies    Review of Systems  Constitutional: Negative for fever, chills, activity change, appetite change and fatigue.  HENT: Positive for congestion, ear pain, postnasal drip, rhinorrhea, sinus pressure, sneezing, sore throat and trouble swallowing (has since resolved).   Eyes: Positive for pain, discharge, redness and itching. Negative for photophobia and visual disturbance.  Respiratory: Positive for cough. Negative for chest tightness, shortness of breath and wheezing.   Cardiovascular: Negative for chest pain, palpitations and leg swelling.  Musculoskeletal: Positive for arthralgias, back pain (upper back) and myalgias. Negative for gait problem, joint  swelling, neck pain and neck stiffness.  Skin: Negative for color change and rash.  Neurological: Negative for tremors, weakness and numbness.  Hematological: Negative for adenopathy. Does not bruise/bleed easily.  Psychiatric/Behavioral: Negative for sleep disturbance. The patient is not nervous/anxious.        Objective:   Physical Exam  Nursing note and vitals reviewed. Constitutional: He is oriented to person, place, and time. He appears well-developed.  HENT:  Head: Normocephalic and atraumatic.  Right Ear: Tympanic membrane, external ear and ear canal normal. Tympanic membrane is not injected, not erythematous, not retracted and not bulging.  Left Ear: Tympanic membrane, external ear and ear canal normal. Tympanic membrane is not injected, not erythematous, not retracted and not bulging.  Nose: Mucosal edema and rhinorrhea present. Right sinus exhibits maxillary sinus tenderness. Left sinus exhibits maxillary sinus tenderness.  Mouth/Throat: Uvula is midline. Mucous membranes are pale. No trismus in the jaw. No uvula swelling. Posterior oropharyngeal erythema (with white streaking of PND) present. No oropharyngeal exudate or posterior oropharyngeal edema.  Eyes: EOM are normal. Pupils are equal, round, and reactive to light. Lids are everted and swept, no foreign bodies found. Right eye exhibits discharge. Right eye exhibits no chemosis and no exudate. Left eye exhibits discharge and exudate. Left eye exhibits no chemosis. Right conjunctiva is injected. Left conjunctiva is injected. No scleral icterus.  Fundoscopic exam:      The right eye shows no arteriolar narrowing, no AV nicking, no exudate and no papilledema.       The left eye shows no arteriolar narrowing, no AV nicking, no exudate and no papilledema.  Normal funduscopic exam green purulent discharge in medial epicanthus Lt>Rt  Neck: Neck supple. No thyromegaly present.  Cardiovascular: Normal rate, regular rhythm and normal  heart sounds.  Exam reveals no gallop and no friction rub.   No murmur heard. Pulmonary/Chest: Effort normal and breath sounds normal. No stridor. He has no wheezes. He has no rales. He exhibits no tenderness.  Abdominal: He exhibits no distension. There is no tenderness. There is no rebound.  Musculoskeletal: Normal range of motion. He exhibits no edema.  In lower right rhomboid immed medial to right scapula was a palpable muscle spasm - palpation reproduced pt's pain.  Lymphadenopathy:       Head (right side): No tonsillar adenopathy present.       Head (left side): No tonsillar adenopathy present.    He has no cervical adenopathy.       Right: No supraclavicular adenopathy present.       Left: No supraclavicular adenopathy present.  Neurological: He is alert and oriented to person, place, and time. He exhibits normal muscle tone. Coordination normal.  Skin: No rash noted. No erythema.  Psychiatric: He has a normal mood and affect. His behavior is normal.      Filed Vitals:   04/18/14  1154  BP: 116/72  Pulse: 81  Temp: 98.6 F (37 C)  Resp: 16       Assessment & Plan:    1:06 PM-Discussed treatment plan with pt at bedside and pt agreed to plan.  Muscle spasm of back - advised heat, stretching, massage, nsaids and prn flexeril qhs.    Acute bacterial conjunctivitis of both eyes - if gtts expensive, can have pharmacist call to change  Allergic cough - yearly - responds well to alb inhaler so refilled.  Allergic rhinitis  Meds ordered this encounter  Medications  . albuterol (PROVENTIL HFA;VENTOLIN HFA) 108 (90 BASE) MCG/ACT inhaler    Sig: Inhale 2 puffs into the lungs every 4 (four) hours as needed for wheezing or shortness of breath (cough, shortness of breath or wheezing.).    Dispense:  1 Inhaler    Refill:  1  . meloxicam (MOBIC) 7.5 MG tablet    Sig: Take 1 tablet (7.5 mg total) by mouth daily.    Dispense:  30 tablet    Refill:  0  . ofloxacin (OCUFLOX) 0.3 %  ophthalmic solution    Sig: 2 gtts in each eye every 2 hours x 2d, then qid x 5d.    Dispense:  10 mL    Refill:  0  . cetirizine (ZYRTEC) 10 MG tablet    Sig: Take 1 tablet (10 mg total) by mouth at bedtime.    Dispense:  30 tablet    Refill:  11  . fluticasone (FLONASE) 50 MCG/ACT nasal spray    Sig: Place 2 sprays into both nostrils at bedtime.    Dispense:  16 g    Refill:  2  . cyclobenzaprine (FLEXERIL) 10 MG tablet    Sig: Take 1 tablet (10 mg total) by mouth 3 (three) times daily as needed for muscle spasms.    Dispense:  30 tablet    Refill:  0    I personally performed the services described in this documentation, which was scribed in my presence. The recorded information has been reviewed and considered, and addended by me as needed.  Delman Cheadle, MD MPH

## 2014-04-25 ENCOUNTER — Telehealth: Payer: Self-pay

## 2014-04-25 NOTE — Telephone Encounter (Signed)
Pt called and states saw dr Brigitte Pulse on 04/18/14, pt states all symptoms are better, except the sore throat. Please advise pt what to do about the sore throat

## 2014-04-26 MED ORDER — FIRST-DUKES MOUTHWASH MT SUSP: 5.0000 mL | OROMUCOSAL | Status: DC | PRN

## 2014-04-26 NOTE — Telephone Encounter (Signed)
LMVM to CB. 

## 2014-04-26 NOTE — Telephone Encounter (Signed)
If the throat is only sore in the am it is most likely from dry throat and possible PND.  If it is sore all day and does not improve throughout the day he should RTC for recheck.

## 2014-04-26 NOTE — Telephone Encounter (Signed)
Pt.notified

## 2014-04-26 NOTE — Telephone Encounter (Signed)
Pt is immunosuppressed due the medications he is on for his colitis so really needs to come into clinic for a strep test since this is a week out is sxs to not start to improve.  In the meantime, lets try him on some magic mouthwash which was sent to pharmacy - this should reduce inflammation/swelling and treat any yeast that causing problems.

## 2014-04-26 NOTE — Telephone Encounter (Signed)
Called patient and he said that he was told by Dr. Brigitte Pulse that if his throat does not improve she would call him in something different.  He states that all other symptoms are improved but that sore throat is bothering him all night and day and he has gargled in salt water and used halls but none of that is helping.  Please advise.

## 2014-05-05 ENCOUNTER — Other Ambulatory Visit: Payer: Self-pay | Admitting: *Deleted

## 2014-05-05 MED ORDER — DICYCLOMINE HCL 20 MG PO TABS: 20.0000 mg | ORAL_TABLET | Freq: Two times a day (BID) | ORAL | Status: DC

## 2014-05-23 ENCOUNTER — Telehealth: Payer: Self-pay | Admitting: Internal Medicine

## 2014-05-23 ENCOUNTER — Other Ambulatory Visit: Payer: Self-pay | Admitting: Internal Medicine

## 2014-05-23 MED ORDER — ADALIMUMAB 40 MG/0.8ML ~~LOC~~ AJKT: 1.0000 "pen " | AUTO-INJECTOR | SUBCUTANEOUS | Status: DC

## 2014-05-23 NOTE — Telephone Encounter (Signed)
Rx sent 

## 2014-05-31 ENCOUNTER — Telehealth: Payer: Self-pay | Admitting: Internal Medicine

## 2014-05-31 MED ORDER — ADALIMUMAB 40 MG/0.8ML ~~LOC~~ AJKT: 1.0000 "pen " | AUTO-INJECTOR | SUBCUTANEOUS | Status: DC

## 2014-05-31 NOTE — Telephone Encounter (Signed)
Patient states that he has had trouble getting rx for [name] filled because it was "sent to the wrong [facility] specialty pharmacy." I advised that I sent refill to the same pharmacy I always have. I called the phone number that patient provided to get clarification on the problem (phone [phone]). Per Cori Razor, the Specialty pharmacy in [place] is the correct location to send escripts to (this is where rx was already sent). Apparently, all scripts go to a central location and are then routed to the appropriate locations. Not sure what happened with Mr.Smoak script. I also have given a verbal order to [facility], pharmacist for an additional refill of [name] since patient will run out before his appointment on 07/04/14. Patient advised.

## 2014-07-01 ENCOUNTER — Ambulatory Visit (INDEPENDENT_AMBULATORY_CARE_PROVIDER_SITE_OTHER): Payer: Self-pay | Admitting: Emergency Medicine

## 2014-07-01 VITALS — BP 104/68 | HR 60 | Temp 97.6°F | Resp 16 | Ht 68.0 in | Wt 179.0 lb

## 2014-07-01 DIAGNOSIS — K5190 Ulcerative colitis, unspecified, without complications: Secondary | ICD-10-CM

## 2014-07-01 DIAGNOSIS — Z0000 Encounter for general adult medical examination without abnormal findings: Secondary | ICD-10-CM

## 2014-07-03 ENCOUNTER — Encounter: Payer: Self-pay | Admitting: Emergency Medicine

## 2014-07-03 NOTE — Progress Notes (Signed)
[facility] [address] [place] [address] [phone]  Date:  07/01/2014   Name:  [name]   DOB:  06/29/1961   MRN:  [id number]  PCP:  Leandrew Koyanagi, MD    Chief Complaint: Annual Exam   History of Present Illness:  [name] is a 53 y.o. very pleasant male patient who presents with the following:  For wellness examination   Patient Active Problem List   Diagnosis Date Noted  . UC (ulcerative colitis) 08/18/2012  . ANEMIA, IRON DEFICIENCY 04/11/2010  . ULCERATIVE COLITIS 04/11/2010    Past Medical History  Diagnosis Date  . Ulcerative colitis   . Iron deficiency anemia   . Arthritis   . Adenomatous colon polyp   . 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 Knee arthroscopy      right  . Fracture left ankle    . Rotator cuff right shoulder      History  Substance Use Topics  . Smoking status: Former Smoker    Quit date: 04/22/1991  . Smokeless tobacco: Never Used  . Alcohol Use: No    Family History  Problem Relation Age of Onset  . Colitis Mother   . Colon cancer Neg Hx     No Known Allergies  Medication list has been reviewed and updated.  Current Outpatient Prescriptions on File Prior to Visit  Medication Sig Dispense Refill  . Adalimumab ([name] PEN) 40 MG/0.8ML PNKT Inject 1 pen into the skin once a week.  4 each  0  . dicyclomine (BENTYL) 20 MG tablet Take 1 tablet (20 mg total) by mouth 2 (two) times daily.  60 tablet  1   No current facility-administered medications on file prior to visit.    Review of Systems:  As per HPI, otherwise negative.    Physical Examination: Filed Vitals:   07/01/14 1010  BP: 104/68  Pulse: 60  Temp: 97.6 F (36.4 C)  Resp: 16   Filed Vitals:   07/01/14 1010  Height: 5' 8"  (1.727 m)  Weight: 179 lb (81.194 kg)   Body mass index is 27.22 kg/(m^2). Ideal Body Weight: Weight in (lb) to have BMI = 25: 164.1  GEN:  WDWN, NAD, Non-toxic, A & O x 3 HEENT: Atraumatic, Normocephalic. Neck supple. No masses, No LAD. Ears and Nose: No external deformity. CV: RRR, No M/G/R. No JVD. No thrill. No extra heart sounds. PULM: CTA B, no wheezes, crackles, rhonchi. No retractions. No resp. distress. No accessory muscle use. ABD: S, NT, ND, +BS. No rebound. No HSM. EXTR: No c/c/e NEURO Normal gait.  PSYCH: Normally interactive. Conversant. Not depressed or anxious appearing.  Calm demeanor.    Assessment and Plan: Wellness examination   Signed,  Ellison Carwin, MD

## 2014-07-04 ENCOUNTER — Ambulatory Visit (INDEPENDENT_AMBULATORY_CARE_PROVIDER_SITE_OTHER): Payer: BC Managed Care – PPO | Admitting: Internal Medicine

## 2014-07-04 ENCOUNTER — Other Ambulatory Visit (INDEPENDENT_AMBULATORY_CARE_PROVIDER_SITE_OTHER): Payer: BC Managed Care – PPO

## 2014-07-04 ENCOUNTER — Encounter: Payer: Self-pay | Admitting: Internal Medicine

## 2014-07-04 VITALS — BP 100/70 | HR 80 | Ht 68.0 in | Wt 181.0 lb

## 2014-07-04 DIAGNOSIS — K5190 Ulcerative colitis, unspecified, without complications: Secondary | ICD-10-CM

## 2014-07-04 DIAGNOSIS — D509 Iron deficiency anemia, unspecified: Secondary | ICD-10-CM

## 2014-07-04 DIAGNOSIS — D899 Disorder involving the immune mechanism, unspecified: Secondary | ICD-10-CM

## 2014-07-04 DIAGNOSIS — D849 Immunodeficiency, unspecified: Secondary | ICD-10-CM

## 2014-07-04 LAB — CBC WITH DIFFERENTIAL/PLATELET
BASOS PCT: 0.6 % (ref 0.0–3.0)
Basophils Absolute: 0.1 10*3/uL (ref 0.0–0.1)
EOS PCT: 3.5 % (ref 0.0–5.0)
Eosinophils Absolute: 0.4 10*3/uL (ref 0.0–0.7)
HEMATOCRIT: 43.1 % (ref 39.0–52.0)
Hemoglobin: 14.5 g/dL (ref 13.0–17.0)
Lymphocytes Relative: 15.3 % (ref 12.0–46.0)
Lymphs Abs: 1.5 10*3/uL (ref 0.7–4.0)
MCHC: 33.5 g/dL (ref 30.0–36.0)
MCV: 89.3 fl (ref 78.0–100.0)
MONOS PCT: 8.6 % (ref 3.0–12.0)
Monocytes Absolute: 0.9 10*3/uL (ref 0.1–1.0)
NEUTROS PCT: 72 % (ref 43.0–77.0)
Neutro Abs: 7.2 10*3/uL (ref 1.4–7.7)
Platelets: 320 10*3/uL (ref 150.0–400.0)
RBC: 4.83 Mil/uL (ref 4.22–5.81)
RDW: 15.1 % (ref 11.5–15.5)
WBC: 10 10*3/uL (ref 4.0–10.5)

## 2014-07-04 MED ORDER — ADALIMUMAB 40 MG/0.8ML ~~LOC~~ AJKT: 1.0000 "pen " | AUTO-INJECTOR | SUBCUTANEOUS | Status: DC

## 2014-07-04 MED ORDER — DICYCLOMINE HCL 20 MG PO TABS: 20.0000 mg | ORAL_TABLET | Freq: Two times a day (BID) | ORAL | Status: DC

## 2014-07-04 NOTE — Progress Notes (Signed)
Delshon Blanchfield 24-Nov-1961 [id number]  Note: This dictation was prepared with Dragon digital system. Any transcriptional errors that result from this procedure are unintentional.   History of Present Illness:  This is a 53 year old white male with ulcerative colitis since 1995. Her last office visit was in August 2014. He was initially followed by Dr.Buccini in 2011. He has been off steroids now for 3 years. He has been on [name] 40 mg weekly and methotrexate 15 mg weekly. His last colonoscopy in May 2012 showed pancolitis and a tubular adenoma. He denies diarrhea or abdominal pain. The last hemoglobin was normal on iron supplements.     Past Medical History  Diagnosis Date  . Ulcerative colitis   . Iron deficiency anemia   . Arthritis   . Adenomatous colon polyp   . 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 Knee arthroscopy      right  . Fracture left ankle    . Rotator cuff right shoulder      No Known Allergies  Family history and social history have been reviewed.  Review of Systems: Denies abdominal pain nausea vomiting. Positive for small amount of blood per rectum  The remainder of the 10 point ROS is negative except as outlined in the H&P  Physical Exam: General Appearance Well developed, in no distress Eyes  Non icteric  HEENT  Non traumatic, normocephalic  Mouth No lesion, tongue papillated, no cheilosis Neck Supple without adenopathy, thyroid not enlarged, no carotid bruits, no JVD Lungs Clear to auscultation bilaterally COR Normal S1, normal S2, regular rhythm, no murmur, quiet precordium Abdomen Musculoskeletal, soft. Normoactive bowel sounds  Rectal Not done  Extremities  No pedal edema Skin No lesions Neurological Alert and oriented x 3 Psychological Normal mood and affect  Assessment and Plan:   Problem #54 53 year old white male with ulcerative colitis of 20 years duration. He is currently in remission. He is  to continue [name] 40 mg weekly. We will reduce methotrexate 10 mg weekly for 4 weeks then 5 mg weekly for 4 weeks. We will be checking his blood count and iron studies today. He will schedule colonoscopy in the next few months. I will see him in one year.    [name] 07/04/2014

## 2014-07-04 NOTE — Patient Instructions (Signed)
We have sent the following prescriptions to your mail in pharmacy: [name]  If you have not heard from your mail in pharmacy within 1 week or if you have not received your medication in the mail, please contact us at [phone] so we may find out why.  We have sent the following medications to your pharmacy for you to pick up at your convenience: [facility] has requested that you go to the basement for the following lab work before leaving today: CBC, [place] will be due for a recall colonoscopy in 08/2014. We will send you a reminder in the mail when it gets closer to that time.  Please follow up with Dr Olevia Perches in 1 year.  CC:Dr Tami Lin

## 2014-07-05 LAB — IBC PANEL
Iron: 49 ug/dL (ref 42–165)
Saturation Ratios: 14.6 % — ABNORMAL LOW (ref 20.0–50.0)
TRANSFERRIN: 240.3 mg/dL (ref 212.0–360.0)

## 2014-07-06 ENCOUNTER — Encounter: Payer: Self-pay | Admitting: Internal Medicine

## 2014-07-14 ENCOUNTER — Other Ambulatory Visit: Payer: Self-pay | Admitting: Internal Medicine

## 2014-07-14 ENCOUNTER — Encounter: Payer: Self-pay | Admitting: Internal Medicine

## 2014-07-14 NOTE — Progress Notes (Signed)
I have spoken to Tyndall at [facility]. She has given me a different fax number to send patient's [name] rx in the future to avoid any confusion. I have entered that information into the system [facility] [place] location). I have also given a verbal order for [name] rx sent on 07/04/14 since that rx was never received.

## 2014-07-21 ENCOUNTER — Telehealth: Payer: Self-pay | Admitting: Internal Medicine

## 2014-07-21 ENCOUNTER — Other Ambulatory Visit: Payer: Self-pay | Admitting: *Deleted

## 2014-07-21 MED ORDER — METHOTREXATE 2.5 MG PO TABS: ORAL_TABLET | ORAL | Status: DC

## 2014-07-21 NOTE — Telephone Encounter (Signed)
Advised that patient takes 15 mg weekly of methotrexate.

## 2014-08-02 ENCOUNTER — Ambulatory Visit (INDEPENDENT_AMBULATORY_CARE_PROVIDER_SITE_OTHER)
Admission: RE | Admit: 2014-08-02 | Discharge: 2014-08-02 | Disposition: A | Discharge status: Home / Self Care | Payer: BC Managed Care – PPO | Source: Ambulatory Visit | Attending: Internal Medicine | Admitting: Internal Medicine

## 2014-08-02 ENCOUNTER — Telehealth: Payer: Self-pay | Admitting: *Deleted

## 2014-08-02 DIAGNOSIS — D849 Immunodeficiency, unspecified: Secondary | ICD-10-CM

## 2014-08-02 DIAGNOSIS — D899 Disorder involving the immune mechanism, unspecified: Secondary | ICD-10-CM

## 2014-08-02 DIAGNOSIS — R7611 Nonspecific reaction to tuberculin skin test without active tuberculosis: Secondary | ICD-10-CM

## 2014-08-02 NOTE — Telephone Encounter (Signed)
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 He will come for chest xray today.

## 2014-11-17 ENCOUNTER — Telehealth: Payer: Self-pay | Admitting: Internal Medicine

## 2014-11-17 NOTE — Telephone Encounter (Signed)
Spoke with patient and scheduled recall colonoscopy on 12/20/14 at 2:30 PM and pre visit on 12/14/14 at 11:30 AM.

## 2014-12-14 ENCOUNTER — Ambulatory Visit (AMBULATORY_SURGERY_CENTER): Payer: Self-pay | Admitting: *Deleted

## 2014-12-14 VITALS — Ht 68.0 in | Wt 187.8 lb

## 2014-12-14 DIAGNOSIS — K5190 Ulcerative colitis, unspecified, without complications: Secondary | ICD-10-CM

## 2014-12-14 MED ORDER — MOVIPREP 100 G PO SOLR: 1.0000 | Freq: Once | ORAL | Status: DC

## 2014-12-14 NOTE — Progress Notes (Signed)
No egg or soy allergy. Doesn't eat soy  ewm No issues with past sedation. ewm No diet pills.ewm Pt declined emmi video. ewm

## 2014-12-20 ENCOUNTER — Encounter: Payer: Self-pay | Admitting: Internal Medicine

## 2014-12-20 ENCOUNTER — Ambulatory Visit (AMBULATORY_SURGERY_CENTER): Payer: BLUE CROSS/BLUE SHIELD | Admitting: Internal Medicine

## 2014-12-20 VITALS — BP 109/63 | HR 56 | Temp 96.4°F | Resp 23 | Ht 68.0 in | Wt 187.0 lb

## 2014-12-20 DIAGNOSIS — K5190 Ulcerative colitis, unspecified, without complications: Secondary | ICD-10-CM

## 2014-12-20 DIAGNOSIS — D122 Benign neoplasm of ascending colon: Secondary | ICD-10-CM

## 2014-12-20 DIAGNOSIS — D127 Benign neoplasm of rectosigmoid junction: Secondary | ICD-10-CM

## 2014-12-20 DIAGNOSIS — D128 Benign neoplasm of rectum: Secondary | ICD-10-CM

## 2014-12-20 DIAGNOSIS — D120 Benign neoplasm of cecum: Secondary | ICD-10-CM

## 2014-12-20 DIAGNOSIS — D123 Benign neoplasm of transverse colon: Secondary | ICD-10-CM

## 2014-12-20 DIAGNOSIS — K51911 Ulcerative colitis, unspecified with rectal bleeding: Secondary | ICD-10-CM

## 2014-12-20 MED ORDER — SODIUM CHLORIDE 0.9 % IV SOLN: 500.0000 mL | INTRAVENOUS | Status: DC

## 2014-12-20 MED ORDER — MESALAMINE 4 G RE ENEM: 4.0000 g | ENEMA | RECTAL | Status: DC

## 2014-12-20 NOTE — Progress Notes (Signed)
Called to room to assist during endoscopic procedure.  Patient ID and intended procedure confirmed with present staff. Received instructions for my participation in the procedure from the performing physician.  

## 2014-12-20 NOTE — Progress Notes (Signed)
A/ox3 pleased with MAC, report to [employer]

## 2014-12-20 NOTE — Patient Instructions (Addendum)
YOU HAD AN ENDOSCOPIC PROCEDURE TODAY AT [facility]: Refer to the procedure report that was given to you for any specific questions about what was found during the examination.  If the procedure report does not answer your questions, please call your gastroenterologist to clarify.  If you requested that your care partner not be given the details of your procedure findings, then the procedure report has been included in a sealed envelope for you to review at your convenience later.  YOU SHOULD EXPECT: Some feelings of bloating in the abdomen. Passage of more gas than usual.  Walking can help get rid of the air that was put into your GI tract during the procedure and reduce the bloating. If you had a lower endoscopy (such as a colonoscopy or flexible sigmoidoscopy) you may notice spotting of blood in your stool or on the toilet paper. If you underwent a bowel prep for your procedure, then you may not have a normal bowel movement for a few days.  DIET: Your first meal following the procedure should be a light meal and then it is ok to progress to your normal diet.  A half-sandwich or bowl of soup is an example of a good first meal.  Heavy or fried foods are harder to digest and may make you feel nauseous or bloated.  Likewise meals heavy in dairy and vegetables can cause extra gas to form and this can also increase the bloating.  Drink plenty of fluids but you should avoid alcoholic beverages for 24 hours.  ACTIVITY: Your care partner should take you home directly after the procedure.  You should plan to take it easy, moving slowly for the rest of the day.  You can resume normal activity the day after the procedure however you should NOT DRIVE or use heavy machinery for 24 hours (because of the sedation medicines used during the test).    SYMPTOMS TO REPORT IMMEDIATELY: A gastroenterologist can be reached at any hour.  During normal business hours, 8:30 AM to 5:00 PM Monday through Friday,  call [phone].  After hours and on weekends, please call the GI answering service at [phone] who will take a message and have the physician on call contact you.   Following lower endoscopy (colonoscopy or flexible sigmoidoscopy):  Excessive amounts of blood in the stool  Significant tenderness or worsening of abdominal pains  Swelling of the abdomen that is new, acute  Fever of 100F or higher  FOLLOW UP: If any biopsies were taken you will be contacted by phone or by letter within the next 1-3 weeks.  Call your gastroenterologist if you have not heard about the biopsies in 3 weeks.  Our staff will call the home number listed on your records the next business day following your procedure to check on you and address any questions or concerns that you may have at that time regarding the information given to you following your procedure. This is a courtesy call and so if there is no answer at the home number and we have not heard from you through the emergency physician on call, we will assume that you have returned to your regular daily activities without incident.  SIGNATURES/CONFIDENTIALITY: You and/or your care partner have signed paperwork which will be entered into your electronic medical record.  These signatures attest to the fact that that the information above on your After Visit Summary has been reviewed and is understood.  Full responsibility of the confidentiality of this  discharge information lies with you and/or your care-partner.  Recommendations Rowasa enemas ordered and sent to pharmacy. Please follow directions according to instructions. Continue [name] Polyp handout provided to patient.

## 2014-12-20 NOTE — Op Note (Signed)
[facility]  [address] [place], [address]   COLONOSCOPY PROCEDURE REPORT  PATIENT: [name], [name]  MR#: [id number] BIRTHDATE: 1961-11-06 , 53  yrs. old GENDER: male ENDOSCOPIST: Lafayette Dragon, MD REFERRED XM:IWOEHO Laney Pastor, M.D. PROCEDURE DATE:  12/20/2014 PROCEDURE:   Colonoscopy with biopsy and Colonoscopy with snare polypectomy First Screening Colonoscopy - Avg.  risk and is 50 yrs.  old or older - No.  Prior Negative Screening - Now for repeat screening. N/A  History of Adenoma - Now for follow-up colonoscopy & has been > or = to 3 yrs.  Yes hx of adenoma.  Has been 3 or more years since last colonoscopy.  Polyps Removed Today? Yes. ASA CLASS:   Class II INDICATIONS:ulcerative colitis of 30 years duration..  Last colonoscopy in May 2012 left sided colitis.  Right colon tubular adenoma.  Multiple pseudopolyps.  Severe colitis from 0-25 cm. Patient has been on [name]. MEDICATIONS: Monitored anesthesia care and Propofol 400 mg IV  DESCRIPTION OF PROCEDURE:   After the risks benefits and alternatives of the procedure were thoroughly explained, informed consent was obtained.  The digital rectal exam revealed no abnormalities of the rectum.   The LB [id number]  endoscope was introduced through the anus and advanced to the cecum, which was identified by both the appendix and ileocecal valve. No adverse events experienced.   The quality of the prep was good, using MoviPrep  The instrument was then slowly withdrawn as the colon was fully examined.      COLON FINDINGS: There was severe colitis from 0-25 cm, , bleeding mucosa.  Ulcerations.  A polyp 1 cm in diameter semi-pedunculated was removed with hot snare from 20 cm.  It was a mild colonic stricture at 20 cm which allowed the endoscope to traverse with minimal resistance multiple biopsies were obtained from rectosigmoid in container #4.  Rectal polyp was collected in specimen container #5  retroflexion of the endoscope in the rectum confirmed presence of severe colitis with contact bleeding between 30 and 50 cm from the rectum.  There were multiple pseudopolyps which extended to the splenic flexure ,transverse colon and hepatic flexure.  Multiple biopsies were obtained in container #3.  In the ascending colon there was a polyp which was snared and sent to pathology.  Multiple random biopsies were obtained from ascending colon which showed no evidence of active colitis , the haustral pattern was somewhat decreased and there was hypervascularity of the mucosa.  colon ileocecal valveand cecal pouch were unremarkable.  Retroflexed views revealed no abnormalities. The time to cecum=4 minutes 12 seconds.  Withdrawal time=13 minutes 57 seconds.  The scope was withdrawn and the procedure completed. COMPLICATIONS: There were no immediate complications.  ENDOSCOPIC IMPRESSION: There was severe colitis from 0-25 cm, , bleeding mucosa. Ulcerations.  A polyp 1 cm in diameter semi-pedunculated was removed with hot snare from 20 cm.  It was a mild colonic stricture at 20 cm which allowed the endoscope to traverse with minimal resistance multiple biopsies were obtained from rectosigmoid in container #4.  Rectal polyp was collected in specimen container #5 retroflexion of the endoscope in the rectum confirmed presence of severe colitis with contact bleeding between 30 and 50 cm from the rectum.  There were multiple pseudopolyps which extended to the splenic flexure ,transverse colon and hepatic flexure.  Multiple biopsies were obtained in container #3.  In the ascending colon there was a polyp which was snared and sent to pathology.  Multiple random  biopsies were obtained from ascending colon which showed no evidence of active colitis , the haustral pattern was somewhat decreased and there was hypervascularity of the mucosa.  colon ileocecal valveand cecal pouch were unremarkable  in  summary there was severe active colitis from 0-25 cm. multiple biopsies obtained rectosigmoid colon polyp and descending colon polyps removed  with cold snare Multiple pseudopolyps throughout transverse colon and descending colon No active colitis in the ascending colon and cecum  RECOMMENDATIONS: 1.  Await pathology results 2.  Continue [name] Follow H&H Add Rowasa enemas Recall colonoscopy in 2 years or earlier pending biopsies Total colectomy is under consideration to reduce the risk of colorectal cancer  eSigned:  Lafayette Dragon, MD 12/20/2014 3:16 PM   cc:   PATIENT NAME:  Johny, Pitstick MR#: [id number]

## 2014-12-21 ENCOUNTER — Telehealth: Payer: Self-pay | Admitting: *Deleted

## 2014-12-21 ENCOUNTER — Telehealth: Payer: Self-pay | Admitting: Internal Medicine

## 2014-12-21 NOTE — Telephone Encounter (Signed)
  Follow up Call-  Call back number 12/20/2014  Post procedure Call Back phone  # [phone]  Permission to leave phone message Yes     Patient questions:  Do you have a fever, pain , or abdominal swelling? No. Pain Score  0 *  Have you tolerated food without any problems? Yes.    Have you been able to return to your normal activities? Yes.    Do you have any questions about your discharge instructions: Diet   No. Medications  No. Follow up visit  No.  Do you have questions or concerns about your Care? No.  Actions: * If pain score is 4 or above: No action needed, pain <4.

## 2014-12-21 NOTE — Telephone Encounter (Signed)
Spoke with patient about prior authorization for Moviprep. Unable to obtain this per Dixon Boos, CMA.

## 2014-12-21 NOTE — Telephone Encounter (Signed)
Left a message for patient to call back. 

## 2014-12-26 ENCOUNTER — Encounter: Payer: Self-pay | Admitting: Internal Medicine

## 2014-12-27 ENCOUNTER — Telehealth: Payer: Self-pay | Admitting: *Deleted

## 2014-12-27 NOTE — Telephone Encounter (Signed)
Per your 12/20/14 colonoscopy report, you indicated that you want [name] on Rowasa Enemas. However, this patient says that he previously stressed to you that his "body had a hard time retaining enemas." He states that he wants canasa suppositories specifically. Please advise.... Do you want enemas or suppositories?

## 2014-12-27 NOTE — Telephone Encounter (Signed)
OK to give Canasa supp 1035m, #30 1 qhs, 3 refills.

## 2014-12-28 NOTE — Telephone Encounter (Signed)
Left message for patient to call back  

## 2014-12-29 MED ORDER — MESALAMINE 1000 MG RE SUPP: 1000.0000 mg | Freq: Every day | RECTAL | Status: DC

## 2014-12-29 NOTE — Telephone Encounter (Signed)
Left message for patient to call back  

## 2014-12-29 NOTE — Telephone Encounter (Signed)
I have spoken to patient to advise that we have sent canasa suppositories. I have also advised that we have sent a free voucher for moviprep to casie at his pharmacy.

## 2015-01-09 ENCOUNTER — Telehealth: Payer: Self-pay | Admitting: Internal Medicine

## 2015-01-09 NOTE — Telephone Encounter (Signed)
Prior authorization has been initiated through [email or web address].

## 2015-01-11 NOTE — Telephone Encounter (Signed)
Patient's insurance, [employer] has approved patient's [name] from 01/09/15-12/14/2038. Reference number: [id number]

## 2015-01-17 NOTE — Telephone Encounter (Signed)
Patient states that rx for [name] still isnt approved. I have advised that it was. I contacted [place] Hal Hope M) and am told that a claim ran for today shows authorization. I have spoken to [name] and am told that authorization does not havppen for them. They get a note saying "adverse rather [place] use PA required. Quantity limit may apply. Max daily dose 0.7200.  I explained that insurance company says rx should go through and I gave [facility] the auth number. [facility] states that they have never seen the rejection before and will send the information to a dedicated prior authorization team for approval. I also spoke to [name] and gave him this information. He will contact me with an update in the next several days.

## 2015-01-26 NOTE — Telephone Encounter (Signed)
Per [name], patient's [name] was delievered to his address on 01/23/15.

## 2015-05-23 ENCOUNTER — Telehealth: Payer: Self-pay | Admitting: *Deleted

## 2015-05-23 ENCOUNTER — Other Ambulatory Visit: Payer: Self-pay | Admitting: *Deleted

## 2015-05-23 NOTE — Telephone Encounter (Signed)
Received a call form [facility] to clarify instructions for [name]. Instructions given.

## 2015-06-21 ENCOUNTER — Telehealth: Payer: Self-pay | Admitting: *Deleted

## 2015-06-21 DIAGNOSIS — K51219 Ulcerative (chronic) proctitis with unspecified complications: Secondary | ICD-10-CM

## 2015-06-21 NOTE — Telephone Encounter (Signed)
Patient notified he is due for CXR in August. Order in [place].

## 2015-07-23 ENCOUNTER — Other Ambulatory Visit: Payer: Self-pay | Admitting: *Deleted

## 2015-07-23 MED ORDER — DICYCLOMINE HCL 20 MG PO TABS: 20.0000 mg | ORAL_TABLET | Freq: Two times a day (BID) | ORAL | Status: DC

## 2015-08-06 ENCOUNTER — Telehealth: Payer: Self-pay | Admitting: *Deleted

## 2015-08-06 NOTE — Telephone Encounter (Signed)
-----   Message from Hulan Saas, RN sent at 07/23/2015  3:52 PM EDT ----- Did patient get CXR (due in August)

## 2015-08-06 NOTE — Telephone Encounter (Signed)
Left a message for patient to call back. 

## 2015-08-07 ENCOUNTER — Ambulatory Visit (INDEPENDENT_AMBULATORY_CARE_PROVIDER_SITE_OTHER)
Admission: RE | Admit: 2015-08-07 | Discharge: 2015-08-07 | Disposition: A | Discharge status: Home / Self Care | Payer: BLUE CROSS/BLUE SHIELD | Source: Ambulatory Visit | Attending: Internal Medicine | Admitting: Internal Medicine

## 2015-08-07 DIAGNOSIS — K51219 Ulcerative (chronic) proctitis with unspecified complications: Secondary | ICD-10-CM | POA: Diagnosis not present

## 2015-08-07 NOTE — Telephone Encounter (Signed)
Patient will come for CXR

## 2015-08-30 ENCOUNTER — Telehealth: Payer: Self-pay | Admitting: Internal Medicine

## 2015-08-30 MED ORDER — DICYCLOMINE HCL 20 MG PO TABS: 20.0000 mg | ORAL_TABLET | Freq: Two times a day (BID) | ORAL | Status: DC

## 2015-08-30 NOTE — Telephone Encounter (Signed)
Doc of the day Please advise.

## 2015-08-30 NOTE — Telephone Encounter (Signed)
Ok

## 2015-09-10 ENCOUNTER — Telehealth: Payer: Self-pay | Admitting: Gastroenterology

## 2015-09-10 ENCOUNTER — Telehealth: Payer: Self-pay | Admitting: Internal Medicine

## 2015-09-10 MED ORDER — HYDROCORTISONE 2.5 % RE CREA: 1.0000 "application " | TOPICAL_CREAM | Freq: Two times a day (BID) | RECTAL | Status: DC

## 2015-09-10 MED ORDER — HYDROCORTISONE ACE-PRAMOXINE 2.5-1 % RE CREA: 1.0000 "application " | TOPICAL_CREAM | Freq: Three times a day (TID) | RECTAL | Status: DC

## 2015-09-10 NOTE — Telephone Encounter (Signed)
Analpram cream rx sent to pharmacy. Patient notified.

## 2015-09-10 NOTE — Telephone Encounter (Signed)
Anusol okay to prescribe

## 2015-09-10 NOTE — Telephone Encounter (Signed)
Received a fax from pharmacy that and anusol HC cream is unavailable. Can patient have Analpram ?

## 2015-09-10 NOTE — Telephone Encounter (Signed)
Former Dr. Olevia Perches pt. With UC. He is calling today asking for Anusol HC. States he has a  Painful hemorrhoid and Dr. Olevia Perches has given it to him in past and it helped. He is scheduled to see Dr. Silverio Decamp on 10/11/15. DOD- Dr Henrene Pastor. Please, advise.

## 2015-09-10 NOTE — Telephone Encounter (Signed)
Rx sent for Anusol HC. Patient notified.

## 2015-09-10 NOTE — Telephone Encounter (Signed)
Yes

## 2015-09-19 ENCOUNTER — Other Ambulatory Visit: Payer: Self-pay

## 2015-09-19 MED ORDER — ADALIMUMAB 40 MG/0.8ML ~~LOC~~ AJKT: 1.0000 "pen " | AUTO-INJECTOR | SUBCUTANEOUS | Status: DC

## 2015-09-20 ENCOUNTER — Telehealth: Payer: Self-pay | Admitting: *Deleted

## 2015-09-20 DIAGNOSIS — K51919 Ulcerative colitis, unspecified with unspecified complications: Secondary | ICD-10-CM

## 2015-09-20 NOTE — Telephone Encounter (Signed)
Former Dr Olevia Perches patient with ulcerative colitis, last seen in office 06/2014. Patient requests refills of [name]. He has an upcoming appointment with Dr Silverio Decamp on 10/11/15. Dr Silverio Decamp, may I fill his [name] until his office visit with you?

## 2015-09-21 NOTE — Telephone Encounter (Signed)
Left message for patient to call back. Orders have been placed for patient to have labwork. It appears that another staff member refilled patient's [name] for #4 pens with 5 additional refills yesterday under Dr Silverio Decamp.

## 2015-09-21 NOTE — Telephone Encounter (Signed)
Ok to refill, please ask patient to do cbc, bmp, lft, b12, folate, iron/ferritin, crp and ESR 

## 2015-09-24 NOTE — Telephone Encounter (Signed)
I have advised patient that Dr Silverio Decamp would like him to have labwork this week. He states that he will come for this. Orders are in EPIC.

## 2015-09-27 ENCOUNTER — Other Ambulatory Visit (INDEPENDENT_AMBULATORY_CARE_PROVIDER_SITE_OTHER): Payer: BLUE CROSS/BLUE SHIELD

## 2015-09-27 DIAGNOSIS — K51919 Ulcerative colitis, unspecified with unspecified complications: Secondary | ICD-10-CM

## 2015-09-27 LAB — CBC WITH DIFFERENTIAL/PLATELET
BASOS ABS: 0.1 10*3/uL (ref 0.0–0.1)
Basophils Relative: 0.7 % (ref 0.0–3.0)
EOS PCT: 3.1 % (ref 0.0–5.0)
Eosinophils Absolute: 0.2 10*3/uL (ref 0.0–0.7)
HCT: 47.5 % (ref 39.0–52.0)
HEMOGLOBIN: 16.1 g/dL (ref 13.0–17.0)
LYMPHS ABS: 1.4 10*3/uL (ref 0.7–4.0)
LYMPHS PCT: 18.4 % (ref 12.0–46.0)
MCHC: 34 g/dL (ref 30.0–36.0)
MCV: 88.2 fl (ref 78.0–100.0)
MONOS PCT: 7.9 % (ref 3.0–12.0)
Monocytes Absolute: 0.6 10*3/uL (ref 0.1–1.0)
NEUTROS PCT: 69.9 % (ref 43.0–77.0)
Neutro Abs: 5.3 10*3/uL (ref 1.4–7.7)
Platelets: 276 10*3/uL (ref 150.0–400.0)
RBC: 5.39 Mil/uL (ref 4.22–5.81)
RDW: 13.6 % (ref 11.5–15.5)
WBC: 7.5 10*3/uL (ref 4.0–10.5)

## 2015-09-27 LAB — BASIC METABOLIC PANEL
BUN: 17 mg/dL (ref 6–23)
CALCIUM: 9.3 mg/dL (ref 8.4–10.5)
CHLORIDE: 103 meq/L (ref 96–112)
CO2: 28 meq/L (ref 19–32)
CREATININE: 0.87 mg/dL (ref 0.40–1.50)
GFR: 97.81 mL/min (ref >60.00)
GLUCOSE: 115 mg/dL — AB (ref 70–99)
Potassium: 4.2 mEq/L (ref 3.5–5.1)
Sodium: 138 mEq/L (ref 135–145)

## 2015-09-27 LAB — HIGH SENSITIVITY CRP: CRP, High Sensitivity: 2.65 mg/L (ref 0.000–5.000)

## 2015-09-27 LAB — HEPATIC FUNCTION PANEL
ALBUMIN: 4 g/dL (ref 3.5–5.2)
ALK PHOS: 82 U/L (ref 39–117)
ALT: 18 U/L (ref 0–53)
AST: 16 U/L (ref 0–37)
BILIRUBIN DIRECT: 0.1 mg/dL (ref 0.0–0.3)
TOTAL PROTEIN: 7.4 g/dL (ref 6.0–8.3)
Total Bilirubin: 0.6 mg/dL (ref 0.2–1.2)

## 2015-09-27 LAB — VITAMIN B12: Vitamin B-12: 1305 pg/mL — ABNORMAL HIGH (ref 211–911)

## 2015-09-27 LAB — FOLATE: FOLATE: 12.1 ng/mL (ref >5.9)

## 2015-09-27 LAB — SEDIMENTATION RATE: Sed Rate: 13 mm/hr (ref 0–22)

## 2015-09-27 LAB — FERRITIN: Ferritin: 46.8 ng/mL (ref 22.0–322.0)

## 2015-10-05 ENCOUNTER — Telehealth: Payer: Self-pay | Admitting: Gastroenterology

## 2015-10-05 ENCOUNTER — Other Ambulatory Visit: Payer: Self-pay | Admitting: Emergency Medicine

## 2015-10-05 MED ORDER — DICYCLOMINE HCL 20 MG PO TABS: 20.0000 mg | ORAL_TABLET | Freq: Two times a day (BID) | ORAL | Status: DC

## 2015-10-05 NOTE — Telephone Encounter (Signed)
Patient requests refill on Bentyl until office visit with Dr Silverio Decamp. We will send a 1 month supply. I advised that Eustaquio Maize will be his new nurse as she works with Dr Silverio Decamp. He verbalizes understanding.

## 2015-10-05 NOTE — Telephone Encounter (Signed)
Caller name: [name]   Reason for call: pt wants a refill on dicyclomine (BENTYL) 20 MG tablet  - pt wants to only speak with Rollene Fare; I advised that Eustaquio Maize was the nurse and he refuses to speak with anyone but [place] or [place].  I advised that Rollene Fare would not be able to call due to death in family, and patient stated "that does not help me, why are you being so unhelpful".  I apologized that Rollene Fare would be unable to call him, and that [place] works with another provider.  Advised I would send the message to [facility] with request to only speak with [facility] or [place], and have her route as appropriate.  But told me that he was going to come up here and speak to whoever he wanted to.  I ended call.

## 2015-10-11 ENCOUNTER — Ambulatory Visit: Payer: BLUE CROSS/BLUE SHIELD | Admitting: Gastroenterology

## 2015-10-11 ENCOUNTER — Ambulatory Visit (INDEPENDENT_AMBULATORY_CARE_PROVIDER_SITE_OTHER): Payer: BLUE CROSS/BLUE SHIELD | Admitting: Gastroenterology

## 2015-10-11 ENCOUNTER — Encounter: Payer: Self-pay | Admitting: Gastroenterology

## 2015-10-11 ENCOUNTER — Other Ambulatory Visit: Payer: BLUE CROSS/BLUE SHIELD

## 2015-10-11 VITALS — BP 90/64 | HR 76 | Ht 68.0 in | Wt 190.1 lb

## 2015-10-11 DIAGNOSIS — K5180 Other ulcerative colitis without complications: Secondary | ICD-10-CM

## 2015-10-11 NOTE — Progress Notes (Signed)
[name]    [id number]    February 26, 1961  Primary Care Physician:DOOLITTLE, Linton Ham, MD  Referring Physician: Leandrew Koyanagi, MD [name],  [id number]  Chief complaint: Ulcerative colitis  HPI:  54 year old white male with pan colonic ulcerative colitis since 1995, now on weekly [name] in remission. He has also taperred off methotrexate. He has no specific complaints at today's visit. Denies any nausea, vomiting, abdominal pain, melena or bright red blood per rectum. He had issues with external hemorrhoids few weeks ago, improved with Anusol rectal cream. Last colonoscopy in Jan 2016 showed severe active colitis from 0-25 cm s/p multiple biopsies, rectosigmoid colon polyp and descending colon polyps removed with cold snare. Multiple pseudopolyps throughout transverse colon and descending colon.No active colitis in the ascending colon and cecum     Outpatient Encounter Prescriptions as of 10/11/2015  Medication Sig  . Adalimumab ([name] PEN) 40 MG/0.8ML PNKT Inject 1 pen into the skin once a week.  . dicyclomine (BENTYL) 20 MG tablet Take 1 tablet (20 mg total) by mouth 2 (two) times daily.  . mesalamine (CANASA) 1000 MG suppository Place 1 suppository (1,000 mg total) rectally at bedtime.  . [DISCONTINUED] hydrocortisone-pramoxine (ANALPRAM HC) 2.5-1 % rectal cream Place 1 application rectally 3 (three) times daily.  . [DISCONTINUED] methotrexate (RHEUMATREX) 2.5 MG tablet Take as directed (Patient taking differently: Take 2.5 mg by mouth once a week. Take as directed)   No facility-administered encounter medications on file as of 10/11/2015.    Allergies as of 10/11/2015  . (No Known Allergies)    Past Medical History  Diagnosis Date  . Ulcerative colitis   . Iron deficiency anemia   . Arthritis   . Adenomatous colon polyp   . 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 12-14-14     Past Surgical History  Procedure Laterality  Date  . Knee arthroscopy      right  . Fracture left ankle    . Rotator cuff right shoulder    . Colonoscopy      Family History  Problem Relation Age of Onset  . Colitis Mother   . Colon cancer Neg Hx     Social History   Social History  . Marital Status: Married    Spouse Name: N/A  . Number of Children: 4  . Years of Education: N/A   Occupational History  . tree service    Social History Main Topics  . Smoking status: Former Smoker    Quit date: 04/22/1991  . Smokeless tobacco: Never Used  . Alcohol Use: No  . Drug Use: No  . Sexual Activity: Not on file   Other Topics Concern  . Not on file   Social History Narrative      Review of systems: Review of Systems  Constitutional: Negative for fever and chills.  HENT: Negative.   Eyes: Negative for blurred vision.  Respiratory: Negative for cough, shortness of breath and wheezing.   Cardiovascular: Negative for chest pain and palpitations.  Gastrointestinal: as per HPI Genitourinary: Negative for dysuria, urgency, frequency and hematuria.  Musculoskeletal: Negative for myalgias, back pain and joint pain.  Skin: Negative for itching and rash.  Neurological: Negative for dizziness, tremors, focal weakness, seizures and loss of consciousness.  Endo/Heme/Allergies: Negative for environmental allergies.  Psychiatric/Behavioral: Negative for depression, suicidal ideas and hallucinations.  All other systems reviewed and are negative.   Physical Exam:  Filed Vitals:   10/11/15 1017  BP: 90/64  Pulse: 76   Gen:      No acute distress HEENT:  EOMI, sclera anicteric Neck:     No masses; no thyromegaly Lungs:    Clear to auscultation bilaterally; normal respiratory effort CV:         Regular rate and rhythm; no murmurs Abd:      + bowel sounds; soft, non-tender; no palpable masses, no distension Ext:    No edema; adequate peripheral perfusion Skin:      Warm and dry; no rash Neuro: alert and oriented x  3 Psych: normal mood and affect  Data Reviewed:  Colonoscopy 12/2014 There was severe colitis from 0-25 cm, , bleeding mucosa. Ulcerations. A polyp 1 cm in diameter semi-pedunculated was removed with hot snare from 20 cm. It was a mild colonic stricture at 20 cm which allowed the endoscope to traverse with minimal resistance multiple biopsies were obtained from rectosigmoid in container #4. Rectal polyp was collected in specimen container #5 retroflexion of the endoscope in the rectum confirmed presence of severe colitis with contact bleeding between 30 and 50 cm from the rectum. There were multiple pseudopolyps which extended to the splenic flexure ,transverse colon and hepatic flexure. Multiple biopsies were obtained in container #3. In the ascending colon there was a polyp which was snared and sent to pathology. Multiple random biopsies were obtained from ascending colon which showed no evidence of active colitis , the haustral pattern was somewhat decreased and there was hypervascularity of the mucosa. colon ileocecal valveand cecal pouch were unremarkable 1. Surgical [P], ascending, biopsy - BENIGN COLORECTAL MUCOSA. - ASSOCIATED BENIGN LYMPHOID AGGREGATES. - NO EVIDENCE OF SIGNIFICANT INFLAMMATION, DYSPLASIA OR MALIGNANCY. 2. Surgical [P], transverse, polyp - INFLAMMATORY PSEUDOPOLYP. - NO DYSPLASIA OR MALIGNANCY IDENTIFIED. 3. Surgical [P], descending, biopsy - BENIGN COLORECTAL MUCOSA. - ASSOCIATED BENIGN LYMPHOID AGGREGATES. - NO EVIDENCE OF SIGNIFICANT INFLAMMATION, DYSPLASIA OR MALIGNANCY. 4. Surgical [P], rectum and sigmoid, biopsy - SEVERELY ACTIVE CHRONIC COLITIS WITH ULCERATION. - NO DYSPLASIA OR MALIGNANCY IDENTIFIED. - SEE COMMENT. 5. Surgical [P], rectum, polyp - INFLAMMATORY PSEUDOPOLYP. - NO DYSPLASIA OR MALIGNANCY IDENTIFIED.  CXR Aug 2016 There is no evidence of acute or old tuberculous infection nor other active cardiopulmonary disease.  Assessment and  Plan/Recommendations: 54 year old white male with pan colonic ulcerative colitis since 1995, now on weekly [name] in remission is here for follow up visit.  He uses Canasa suppositories few times a week. His symptoms are overall well controlled Continue [name] 65m weekly Discussed with patient about starting low dose Lialda 1.2gm daily, he is reluctant to start any new medications and will think about it. I discussed in detail the potential benefits and risks Last CXR in Aug 2016 was negative, will obtain Quantiferon gold Due for Surveillance colonoscopy in Jan 2017 Return in 6 months  K. VDenzil Magnuson, MD [phone]-Fri 8a-5p [phone] 5p, weekends, holidays

## 2015-10-11 NOTE — Patient Instructions (Addendum)
We will contact you when your recall is due for your colonoscopy  Follow up as needed  Go to the basement for your lab today

## 2015-10-12 ENCOUNTER — Other Ambulatory Visit: Payer: Self-pay

## 2015-10-13 LAB — QUANTIFERON TB GOLD ASSAY (BLOOD)
INTERFERON GAMMA RELEASE ASSAY: NEGATIVE
Mitogen value: 8.92 IU/mL
Quantiferon Nil Value: 0.03 IU/mL
Quantiferon Tb Ag Minus Nil Value: -0.01 IU/mL
TB AG VALUE: 0.02 [IU]/mL

## 2015-10-19 ENCOUNTER — Telehealth: Payer: Self-pay | Admitting: Gastroenterology

## 2015-10-19 NOTE — Telephone Encounter (Signed)
I have left message for the patient to call back

## 2015-11-12 ENCOUNTER — Encounter: Payer: Self-pay | Admitting: Internal Medicine

## 2015-11-13 ENCOUNTER — Telehealth: Payer: Self-pay | Admitting: Gastroenterology

## 2015-11-13 MED ORDER — DICYCLOMINE HCL 20 MG PO TABS: 20.0000 mg | ORAL_TABLET | Freq: Two times a day (BID) | ORAL | Status: DC

## 2015-11-13 NOTE — Telephone Encounter (Signed)
Sent dicyclomine to patients pharmacy  This is the first request I have seen for this med for this patient     Patient aware med sent

## 2015-12-25 ENCOUNTER — Telehealth: Payer: Self-pay | Admitting: Gastroenterology

## 2015-12-25 MED ORDER — DICYCLOMINE HCL 20 MG PO TABS: 20.0000 mg | ORAL_TABLET | Freq: Two times a day (BID) | ORAL | Status: DC

## 2015-12-25 NOTE — Telephone Encounter (Signed)
L/M for patient that med requested  was sent

## 2015-12-31 ENCOUNTER — Telehealth: Payer: Self-pay | Admitting: *Deleted

## 2015-12-31 MED ORDER — MESALAMINE 1000 MG RE SUPP: 1000.0000 mg | Freq: Every day | RECTAL | Status: DC

## 2015-12-31 NOTE — Telephone Encounter (Signed)
Med request fax from pharmacy

## 2016-01-07 ENCOUNTER — Telehealth: Payer: Self-pay | Admitting: Gastroenterology

## 2016-01-07 NOTE — Telephone Encounter (Signed)
Faxed in [place] letter today to [facility] patient that appeals letter was done today

## 2016-01-17 NOTE — Telephone Encounter (Signed)
Patient informed that med was approved with appeal  Approvel sent to be scanned in

## 2016-04-08 ENCOUNTER — Telehealth: Payer: Self-pay | Admitting: Gastroenterology

## 2016-04-08 NOTE — Telephone Encounter (Signed)
Patient is without insurance presently. He is asking for a sample of [name]. Still in remission. Is it okay to try to obtain a sample?

## 2016-04-08 NOTE — Telephone Encounter (Signed)
Please see if he can get financial assistance to continue [name] but would not recommend just getting intermittent dosing given he could develop resistance to the medication

## 2016-04-09 NOTE — Telephone Encounter (Signed)
Patient notified. He will pick up the form today. Doctor portion of the form faxed to [facility] patient assistance for [name].

## 2016-04-10 NOTE — Telephone Encounter (Signed)
All forms completed. Faxed with a cover sheet to [employer].

## 2016-04-14 ENCOUNTER — Telehealth: Payer: Self-pay | Admitting: Gastroenterology

## 2016-04-14 NOTE — Telephone Encounter (Signed)
Advised Mr Cruces the [facility] has notified us they received the application.

## 2016-04-21 ENCOUNTER — Telehealth: Payer: Self-pay | Admitting: Gastroenterology

## 2016-04-21 NOTE — Telephone Encounter (Signed)
Yes, it is [employer]

## 2016-04-25 ENCOUNTER — Telehealth: Payer: Self-pay | Admitting: Gastroenterology

## 2016-04-25 NOTE — Telephone Encounter (Signed)
Patient calling back regarding this. Best # [phone]

## 2016-04-25 NOTE — Telephone Encounter (Signed)
No answer. Did not leave a message on the voicemail.

## 2016-04-25 NOTE — Telephone Encounter (Signed)
Patient's [name] will be shipped to this office. Please call him when it arrives. Expected on 04/30/16

## 2016-04-30 NOTE — Telephone Encounter (Signed)
[name] arrived. Patient contacted. He states he will come pick it up soon.

## 2016-05-14 ENCOUNTER — Telehealth: Payer: Self-pay | Admitting: Gastroenterology

## 2016-05-16 NOTE — Telephone Encounter (Signed)
returned patients call, gave patient canasa suppositories and printed an application  For Canasa patient assistant program to give to patient to fill out his part and bring back to [place] the next week

## 2016-05-16 NOTE — Telephone Encounter (Signed)
Patient is calling back and would like to speak with someone please.  He would like some resolved before the weekend

## 2016-05-28 ENCOUNTER — Other Ambulatory Visit: Payer: Self-pay

## 2016-05-28 ENCOUNTER — Telehealth: Payer: Self-pay | Admitting: Gastroenterology

## 2016-05-28 MED ORDER — CANASA 1000 MG RE SUPP: 1000.0000 mg | Freq: Every day | RECTAL | Status: DC

## 2016-05-28 NOTE — Telephone Encounter (Signed)
Contacted and corrected the Rx to weekly injections.

## 2016-06-18 ENCOUNTER — Telehealth: Payer: Self-pay | Admitting: *Deleted

## 2016-06-18 NOTE — Telephone Encounter (Signed)
Left message for patient to pick up 90 day supply from [facility] will leave at my desk

## 2016-06-30 ENCOUNTER — Ambulatory Visit (INDEPENDENT_AMBULATORY_CARE_PROVIDER_SITE_OTHER): Payer: Self-pay | Admitting: Physician Assistant

## 2016-06-30 VITALS — BP 118/80 | HR 69 | Temp 98.1°F | Resp 18 | Ht 68.0 in | Wt 187.0 lb

## 2016-06-30 DIAGNOSIS — Z021 Encounter for pre-employment examination: Secondary | ICD-10-CM

## 2016-06-30 DIAGNOSIS — Z0289 Encounter for other administrative examinations: Secondary | ICD-10-CM

## 2016-06-30 NOTE — Patient Instructions (Signed)
     IF you received an x-ray today, you will receive an invoice from [facility]. Please contact [facility] at [phone] with questions or concerns regarding your invoice.   IF you received labwork today, you will receive an invoice from [facility]. Please contact [facility] at [phone] with questions or concerns regarding your invoice.   Our billing staff will not be able to assist you with questions regarding bills from these companies.  You will be contacted with the lab results as soon as they are available. The fastest way to get your results is to activate your My Chart account. Instructions are located on the last page of this paperwork. If you have not heard from us regarding the results in 2 weeks, please contact this office.      

## 2016-06-30 NOTE — Progress Notes (Signed)
Commercial Driver Medical Examination   [name] is a 55 y.o. male who presents today for a commercial driver fitness determination physical exam. The patient reports no problems. The following portions of the patient's history were reviewed and updated as appropriate: allergies, current medications, past family history, past medical history, past social history, past surgical history and problem list.  No significant PMH other than MSK surgeries. Denies hx DM, HTN, tobacco use, OSA. No daytime somnolence. Eats very healthy and exercises.  Works for himself. He works in Management consultant. Does a lot of work for [employer].  Review of Systems Pertinent items are noted in HPI.   Objective:    Vision/hearing:  Visual Acuity Screening   Right eye Left eye Both eyes  Without correction: 20/30 20/25 20/30   With correction:     Comments: Titmus test was 85 degrees in both eyes.  Hearing Screening Comments: Whisper test was 25f in both ears.  Applicant can recognize and distinguish among traffic control signals and devices showing standard red, green, and amber colors.  Corrective lenses required: No  Monocular Vision?: No  Hearing aid requirement: No  Physical Exam  Constitutional: He is oriented to person, place, and time. He appears well-developed and well-nourished. No distress.  HENT:  Head: Normocephalic and atraumatic.  Right Ear: Hearing, tympanic membrane, external ear and ear canal normal.  Left Ear: Hearing, tympanic membrane, external ear and ear canal normal.  Nose: Nose normal.  Mouth/Throat: Uvula is midline, oropharynx is clear and moist and mucous membranes are normal.  Eyes: Conjunctivae, EOM and lids are normal. Pupils are equal, round, and reactive to light. Right eye exhibits no discharge. Left eye exhibits no discharge. No scleral icterus.  Neck: Trachea normal and normal range of motion. Neck supple. Carotid bruit is not present.   Cardiovascular: Normal rate, regular rhythm, normal heart sounds, intact distal pulses and normal pulses.   No murmur heard. Pulmonary/Chest: Effort normal and breath sounds normal. No respiratory distress. He has no wheezes. He has no rhonchi. He has no rales.  Abdominal: Soft. Normal appearance. There is no tenderness.  Musculoskeletal: Normal range of motion. He exhibits no edema or tenderness.  Lymphadenopathy:       Head (right side): No submental, no submandibular and no tonsillar adenopathy present.       Head (left side): No submental, no submandibular and no tonsillar adenopathy present.    He has no cervical adenopathy.       Right: No supraclavicular adenopathy present.       Left: No supraclavicular adenopathy present.  Neurological: He is alert and oriented to person, place, and time. No cranial nerve deficit. Coordination and gait normal.  Skin: Skin is warm, dry and intact.  Psychiatric: He has a normal mood and affect. His speech is normal and behavior is normal. Judgment and thought content normal.   BP 118/80 mmHg  Pulse 69  Temp(Src) 98.1 F (36.7 C) (Oral)  Resp 18  Ht 5' 8"  (1.727 m)  Wt 187 lb (84.823 kg)  BMI 28.44 kg/m2  SpO2 97%  Labs: Comments: sp.gr-1.05,protein-neg,blood-neg-sugar-neg  Assessment:    Healthy male exam.  Meets standards in 42CFR 391.41;  qualifies for 2 year certificate.    Plan:    Medical examiners certificate completed and printed. Return as needed.

## 2016-08-25 ENCOUNTER — Telehealth: Payer: Self-pay | Admitting: Gastroenterology

## 2016-08-25 NOTE — Telephone Encounter (Signed)
3 month supply of [name] to be delivered to this office 08/29/16

## 2016-09-30 ENCOUNTER — Telehealth: Payer: Self-pay | Admitting: Gastroenterology

## 2016-09-30 NOTE — Telephone Encounter (Signed)
Patient was prescribed this in 2015  First Dukes Magic Mouthwash  For thrush  Wanting a refill Dr Silverio Decamp is on vacation this week will send to doc of the day

## 2016-10-01 MED ORDER — FIRST-DUKES MOUTHWASH MT SUSP: 5.0000 mL | OROMUCOSAL | 1 refills | Status: DC | PRN

## 2016-10-01 NOTE — Telephone Encounter (Signed)
Dr Carlean Purl, You are doc of the day this afternoon... This patient wants a script of Dukes Magic Mouthwash sent in.. It looks like he has not actually had it since 2015. He was Dr Diona Browner patient and now Dr Jillyn Hidden  Which she is on vacation this week.  He said he feels like he is getting thrush because he has had it before.  I explained to him he needed to contact his PCP for the prescription especially since Dr Silverio Decamp is not here.;  He stated he does not have a Primary Care Provider

## 2016-10-01 NOTE — Telephone Encounter (Signed)
Its ok to send in a refill - Rx like it was before

## 2016-10-01 NOTE — Telephone Encounter (Signed)
Per Dr Carlean Purl ok to send  Called and informed patient med sent

## 2016-10-20 ENCOUNTER — Ambulatory Visit: Payer: Self-pay | Attending: Internal Medicine

## 2016-12-24 ENCOUNTER — Encounter: Payer: Self-pay | Admitting: Gastroenterology

## 2017-03-06 ENCOUNTER — Telehealth: Payer: Self-pay | Admitting: Gastroenterology

## 2017-03-06 NOTE — Telephone Encounter (Signed)
Please schedule for office visit soon and he is also past due for surveillance colonoscopy

## 2017-03-06 NOTE — Telephone Encounter (Signed)
Left a message for him to call again.  He has been on patient assistance for [name]. He needs renewal. He is past due TB screening. Please advise on office visit and colonoscopy. Thank you.

## 2017-03-09 NOTE — Telephone Encounter (Signed)
Left a message for the patient with the following information. He needs an office visit with the doctor. Okay to use a "held" appointment.He has not been seen in over a year. He is also due TB screening. This is per Dr Silverio Decamp.  Do not schedule a direct colonoscopy.  Yes he is due a colonoscopy, but she wants him to be seen by her first. I called the [employer] patient assistance program. I re-ordered the [name] for him assuming he wants it delivered to our office as before which is fine. The renewal date is 04/23/17. 1) appointment with Dr Silverio Decamp 2) pick up his [name] here after 03/11/17 3) complete the patient assistance application before     2/70/35

## 2017-03-10 NOTE — Telephone Encounter (Signed)
Can he have a PPD instead of Quantiferon Gold. He is uninsured.

## 2017-03-10 NOTE — Telephone Encounter (Signed)
Ok to do PPD. He will need to come in for office visit

## 2017-03-11 ENCOUNTER — Telehealth: Payer: Self-pay | Admitting: *Deleted

## 2017-03-11 ENCOUNTER — Other Ambulatory Visit: Payer: Self-pay

## 2017-03-11 DIAGNOSIS — K5100 Ulcerative (chronic) pancolitis without complications: Secondary | ICD-10-CM

## 2017-03-11 DIAGNOSIS — D638 Anemia in other chronic diseases classified elsewhere: Secondary | ICD-10-CM

## 2017-03-11 NOTE — Telephone Encounter (Signed)
Labs entered. 7 lab orders all reviewed with the patient. He agrees to this plan. [name] to be shipped and picked up here also.

## 2017-03-11 NOTE — Telephone Encounter (Signed)
Success in setting an appointment for the patient. He is scheduled for 04/21/17 for office visit. Please allow him to have his labs before the visit to include Quantiferon Gold and CBC. He has been advised his renewal papers for [name] which must be sent in by 04/23/17 will be signed at his appointment. He expresses understanding. Compliance is an issue for patient.

## 2017-03-11 NOTE — Telephone Encounter (Signed)
[place] shipped here, l/m for patient to come pick this up today

## 2017-03-11 NOTE — Telephone Encounter (Signed)
Agree with doing labs prior to visit. Please have him do CBC, CMP, Ferritin, B12/folate, CRP and PPD or TB quantiferon. Thanks

## 2017-04-09 ENCOUNTER — Other Ambulatory Visit (INDEPENDENT_AMBULATORY_CARE_PROVIDER_SITE_OTHER): Payer: BLUE CROSS/BLUE SHIELD

## 2017-04-09 DIAGNOSIS — K5100 Ulcerative (chronic) pancolitis without complications: Secondary | ICD-10-CM | POA: Diagnosis not present

## 2017-04-09 DIAGNOSIS — D638 Anemia in other chronic diseases classified elsewhere: Secondary | ICD-10-CM

## 2017-04-09 LAB — COMPREHENSIVE METABOLIC PANEL
ALT: 13 U/L (ref 0–53)
AST: 14 U/L (ref 0–37)
Albumin: 4.1 g/dL (ref 3.5–5.2)
Alkaline Phosphatase: 94 U/L (ref 39–117)
BUN: 26 mg/dL — ABNORMAL HIGH (ref 6–23)
CALCIUM: 9.5 mg/dL (ref 8.4–10.5)
CHLORIDE: 106 meq/L (ref 96–112)
CO2: 26 meq/L (ref 19–32)
CREATININE: 0.96 mg/dL (ref 0.40–1.50)
GFR: 86.8 mL/min (ref >60.00)
Glucose, Bld: 99 mg/dL (ref 70–99)
Potassium: 3.9 mEq/L (ref 3.5–5.1)
SODIUM: 138 meq/L (ref 135–145)
Total Bilirubin: 0.3 mg/dL (ref 0.2–1.2)
Total Protein: 7.4 g/dL (ref 6.0–8.3)

## 2017-04-09 LAB — CBC WITH DIFFERENTIAL/PLATELET
BASOS PCT: 0.9 % (ref 0.0–3.0)
Basophils Absolute: 0.1 10*3/uL (ref 0.0–0.1)
EOS ABS: 0.2 10*3/uL (ref 0.0–0.7)
Eosinophils Relative: 2.4 % (ref 0.0–5.0)
HCT: 45.7 % (ref 39.0–52.0)
Hemoglobin: 15.3 g/dL (ref 13.0–17.0)
Lymphocytes Relative: 18.8 % (ref 12.0–46.0)
Lymphs Abs: 1.9 10*3/uL (ref 0.7–4.0)
MCHC: 33.4 g/dL (ref 30.0–36.0)
MCV: 88.5 fl (ref 78.0–100.0)
MONO ABS: 0.8 10*3/uL (ref 0.1–1.0)
Monocytes Relative: 8.5 % (ref 3.0–12.0)
NEUTROS ABS: 6.9 10*3/uL (ref 1.4–7.7)
Neutrophils Relative %: 69.4 % (ref 43.0–77.0)
PLATELETS: 266 10*3/uL (ref 150.0–400.0)
RBC: 5.16 Mil/uL (ref 4.22–5.81)
RDW: 13.9 % (ref 11.5–15.5)
WBC: 10 10*3/uL (ref 4.0–10.5)

## 2017-04-09 LAB — HIGH SENSITIVITY CRP: CRP HIGH SENSITIVITY: 5.08 mg/L — AB (ref 0.000–5.000)

## 2017-04-10 LAB — FOLATE: Folate: 12.7 ng/mL (ref >5.9)

## 2017-04-10 LAB — VITAMIN B12: VITAMIN B 12: 1152 pg/mL — AB (ref 211–911)

## 2017-04-10 LAB — FERRITIN: Ferritin: 75.4 ng/mL (ref 22.0–322.0)

## 2017-04-11 LAB — QUANTIFERON TB GOLD ASSAY (BLOOD)
Interferon Gamma Release Assay: NEGATIVE
QUANTIFERON TB AG MINUS NIL: 0.01 [IU]/mL
Quantiferon Nil Value: 0.02 IU/mL

## 2017-04-21 ENCOUNTER — Ambulatory Visit (INDEPENDENT_AMBULATORY_CARE_PROVIDER_SITE_OTHER): Payer: BLUE CROSS/BLUE SHIELD | Admitting: Gastroenterology

## 2017-04-21 ENCOUNTER — Ambulatory Visit: Payer: Self-pay | Admitting: Gastroenterology

## 2017-04-21 ENCOUNTER — Encounter (INDEPENDENT_AMBULATORY_CARE_PROVIDER_SITE_OTHER): Payer: Self-pay

## 2017-04-21 ENCOUNTER — Encounter: Payer: Self-pay | Admitting: Gastroenterology

## 2017-04-21 VITALS — BP 104/74 | HR 88 | Ht 67.32 in | Wt 186.5 lb

## 2017-04-21 DIAGNOSIS — K5100 Ulcerative (chronic) pancolitis without complications: Secondary | ICD-10-CM

## 2017-04-21 NOTE — Progress Notes (Signed)
[name]    [id number]    06-04-1961  Primary Care Physician:Doolittle, Linton Ham, MD (Inactive)  Referring Physician: No referring provider defined for this encounter.  Chief complaint:  Ulcerative colitis  HPI:  56 year old male with history of pancolonic ulcerative colitis diagnosed in 1995, currently in remission on weekly [name]. He was on methotrexate previously. Patient is here for routine follow-up visit and medication refills. His last seen in October 2016. Last colonoscopy in January 2016 showed severe active colitis from anal verge to 25 cm and also had multiple pseudopolyps throughout transverse and descending colon.  He is currently having one to 2 bowel movements daily and denies any blood in stool. Denies any nausea, vomiting, abdominal pain, melena or bright red blood per rectum. He uses Canasa suppositories intermittently whenever he notices increased pressure in the rectum or increased mucus discharge. He is doing well overall and has no specific GI complaints.   Outpatient Encounter Prescriptions as of 04/21/2017  Medication Sig  . Adalimumab ([name] PEN) 40 MG/0.8ML PNKT Inject 1 pen into the skin once a week.  . [DISCONTINUED] Diphenhyd-Hydrocort-Nystatin (FIRST-DUKES MOUTHWASH) SUSP Use as directed 5 mLs in the mouth or throat every 2 (two) hours as needed.   No facility-administered encounter medications on file as of 04/21/2017.     Allergies as of 04/21/2017  . (No Known Allergies)    Past Medical History:  Diagnosis Date  . Adenomatous colon polyp   . Arthritis   . Iron deficiency anemia   . 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 12-14-14   . Ulcerative colitis     Past Surgical History:  Procedure Laterality Date  . COLONOSCOPY    . fracture left ankle    . KNEE ARTHROSCOPY     right  . rotator cuff right shoulder      Family History  Problem Relation Age of Onset  . Colitis Mother   . Colon cancer Neg Hx      Social History   Social History  . Marital status: Married    Spouse name: N/A  . Number of children: 4  . Years of education: N/A   Occupational History  . tree service    Social History Main Topics  . Smoking status: Former Smoker    Quit date: 04/22/1991  . Smokeless tobacco: Never Used  . Alcohol use No  . Drug use: No  . Sexual activity: Not on file   Other Topics Concern  . Not on file   Social History Narrative  . No narrative on file      Review of systems: Review of Systems  Constitutional: Negative for fever and chills.  HENT: Negative.   Eyes: Negative for blurred vision.  Respiratory: Negative for cough, shortness of breath and wheezing.   Cardiovascular: Negative for chest pain and palpitations.  Gastrointestinal: as per HPI Genitourinary: Negative for dysuria, urgency, frequency and hematuria.  Musculoskeletal: Negative for myalgias, back pain and joint pain.  Skin: Negative for itching and rash.  Neurological: Negative for dizziness, tremors, focal weakness, seizures and loss of consciousness.  Endo/Heme/Allergies: Negative for seasonal allergies.  Psychiatric/Behavioral: Negative for depression, suicidal ideas and hallucinations.  All other systems reviewed and are negative.   Physical Exam: Vitals:   04/21/17 1414  BP: 104/74  Pulse: 88   Body mass index is 28.93 kg/m. Gen:      No acute distress HEENT:  EOMI,  sclera anicteric Neck:     No masses; no thyromegaly Lungs:    Clear to auscultation bilaterally; normal respiratory effort CV:         Regular rate and rhythm; no murmurs Abd:      + bowel sounds; soft, non-tender; no palpable masses, no distension Ext:    No edema; adequate peripheral perfusion Skin:      Warm and dry; no rash Neuro: alert and oriented x 3 Psych: normal mood and affect  Data Reviewed:  Reviewed labs, radiology imaging, old records and pertinent past GI work up   Assessment and  Plan/Recommendations: 56 year old male with history of pancolonic ulcerative colitis diagnosed in 1995 currently in remission on weekly [name] here for follow-up visit  Continue current dose of [name] 40 mg every week CRP was slightly elevated but patient has no clinical change in symptoms He is due for surveillance colonoscopy, schedule it today Patient is not taking mesalamine, he uses Canasa suppositories intermittently Patient is noncompliant with regular follow-up and health maintenance TB 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 Denzil Magnuson , MD [phone] Mon-Fri 8a-5p [phone] after 5p, weekends, holidays  CC: No ref. provider found

## 2017-04-22 ENCOUNTER — Telehealth: Payer: Self-pay | Admitting: Gastroenterology

## 2017-04-22 NOTE — Telephone Encounter (Signed)
Calling about establishing a family member here.

## 2017-04-24 ENCOUNTER — Telehealth: Payer: Self-pay | Admitting: *Deleted

## 2017-04-24 MED ORDER — MESALAMINE 1000 MG RE SUPP: 1000.0000 mg | Freq: Every day | RECTAL | 3 refills | Status: DC

## 2017-04-27 ENCOUNTER — Encounter: Payer: Self-pay | Admitting: *Deleted

## 2017-04-27 ENCOUNTER — Telehealth: Payer: Self-pay | Admitting: *Deleted

## 2017-04-27 MED ORDER — MESALAMINE 1000 MG RE SUPP: 1000.0000 mg | Freq: Every day | RECTAL | 3 refills | Status: DC

## 2017-04-27 NOTE — Telephone Encounter (Signed)
Rx sent to pharmacy   

## 2017-04-27 NOTE — Telephone Encounter (Signed)
Sent in prescription for Canasa to see if it is denied then can proceed with Patient Assistance program

## 2017-04-28 ENCOUNTER — Telehealth: Payer: Self-pay

## 2017-04-28 NOTE — Telephone Encounter (Signed)
Patient assistance has been denied because Mr [name] has insurance. His BCBS requires prior authorization. The patient states he was contacted by "the [name] people" and told his PA was already started. Patient wants me to wait until he knows what is going to  Be approved. He will call me when he get s this information. I have forms at my desk, but will hold them until I hear back from the patient.

## 2017-06-11 NOTE — Telephone Encounter (Signed)
Never received deniel from insurance because patient has [employer] now and prescription coverage  Will not qualify for patient assistance program any longer

## 2017-08-12 ENCOUNTER — Encounter: Payer: Self-pay | Admitting: Gastroenterology

## 2017-08-18 ENCOUNTER — Ambulatory Visit (AMBULATORY_SURGERY_CENTER): Payer: Self-pay

## 2017-08-18 VITALS — Ht 68.0 in | Wt 189.4 lb

## 2017-08-18 DIAGNOSIS — K5100 Ulcerative (chronic) pancolitis without complications: Secondary | ICD-10-CM

## 2017-08-18 MED ORDER — NA SULFATE-K SULFATE-MG SULF 17.5-3.13-1.6 GM/177ML PO SOLN: ORAL | 0 refills | Status: DC

## 2017-08-18 NOTE — Progress Notes (Signed)
Per pt, no allergies to soy or egg products.Pt not taking any weight loss meds or using  O2 at home.   Pt refused Emmi video.  Pt came into the office today for his pre-visit prior to his colon on 09/18/17 with Dr. Silverio Decamp. Pt was in a hurry, did not want to spend much time on answering questions or prep instructions. He states, he was not going to drink the second bottle of the Suprep, if he was cleaned out! Went over prep instructions again with pt, to stress the importance of drinking the prep as instructed or he would have to have the colon repeated, if he was not cleaned out! Pt just smiled at me. He will call back  later to get an earlier appointment!

## 2017-09-03 ENCOUNTER — Telehealth: Payer: Self-pay | Admitting: Gastroenterology

## 2017-09-03 NOTE — Telephone Encounter (Signed)
For [name] I have started a prior authorization through Cover My Meds for [name] REN ASPINALL (Key: [id number]) Your information has been submitted to [place]. [employer] [place] will review the request and fax you a determination directly, typically within 3 business days of your submission once all necessary information is received. If [employer] [place] has not responded in 3 business days or if you have any questions about your submission, contact [place] at [phone].

## 2017-09-07 ENCOUNTER — Telehealth: Payer: Self-pay | Admitting: Gastroenterology

## 2017-09-07 NOTE — Telephone Encounter (Signed)
[employer] had approved the [name] in May 2018. Discount card to be picked up at the front desk. There will also be a card for Canasa supp.  Rx faxed to [facility] for [name] to be mailed to the patient's house.  I have discussed all of this with the patient.

## 2017-09-07 NOTE — Telephone Encounter (Signed)
Spoke with [facility]. Advised the [name] was approved in May 2018 for the patient.

## 2017-09-08 ENCOUNTER — Telehealth: Payer: Self-pay

## 2017-09-09 NOTE — Telephone Encounter (Signed)
Left a message to call 

## 2017-09-11 ENCOUNTER — Encounter: Payer: Self-pay | Admitting: Gastroenterology

## 2017-09-15 ENCOUNTER — Telehealth: Payer: Self-pay | Admitting: Gastroenterology

## 2017-09-16 NOTE — Telephone Encounter (Signed)
Patient calling back about this due to needing his prep for procedure on Friday 10.5.18. Prior [name] is required for suprep so patient wants to know what to do to get it today. Pt had pre visit on 9.4.18

## 2017-09-16 NOTE — Telephone Encounter (Signed)
To appease the patient I have started a PA. but the procedure the procedure is in 72 hours. The turn around time on a PA is usually 72 hours.

## 2017-09-16 NOTE — Telephone Encounter (Signed)
Spoke with patient. Explained to him that we do not do prior auth. On Suprep and that I can give him a coupon. He wants to call [facility] first and he wants to speak with [facility], LPN about this. Will notify Beth.

## 2017-09-16 NOTE — Telephone Encounter (Signed)
Left message for patient to return call, but [name] is already talking to patient its Suprep prior [place]

## 2017-09-17 NOTE — Telephone Encounter (Signed)
Patient says he cannot afford the $50 co-pay on Suprep. I have found a sample for him which he has picked up front this office.

## 2017-09-18 ENCOUNTER — Encounter: Payer: Self-pay | Admitting: Gastroenterology

## 2017-09-18 ENCOUNTER — Ambulatory Visit (AMBULATORY_SURGERY_CENTER): Payer: BLUE CROSS/BLUE SHIELD | Admitting: Gastroenterology

## 2017-09-18 VITALS — BP 106/71 | HR 58 | Temp 97.8°F | Resp 21 | Ht 68.0 in | Wt 189.0 lb

## 2017-09-18 DIAGNOSIS — K5100 Ulcerative (chronic) pancolitis without complications: Secondary | ICD-10-CM

## 2017-09-18 DIAGNOSIS — D129 Benign neoplasm of anus and anal canal: Secondary | ICD-10-CM

## 2017-09-18 DIAGNOSIS — K621 Rectal polyp: Secondary | ICD-10-CM

## 2017-09-18 DIAGNOSIS — D128 Benign neoplasm of rectum: Secondary | ICD-10-CM

## 2017-09-18 MED ORDER — SODIUM CHLORIDE 0.9 % IV SOLN: 500.0000 mL | INTRAVENOUS | Status: DC

## 2017-09-18 NOTE — Progress Notes (Signed)
To PACU, VSS. Report to [email or web address] 

## 2017-09-18 NOTE — Op Note (Signed)
[place] Patient Name: [name] Procedure Date: 09/18/2017 3:21 PM MRN: [id number] Endoscopist: Mauri Pole , MD Age: 56 Referring MD:  Date of Birth: November 03, 1961 Gender: Male Account #: [id number] Procedure:                Colonoscopy Indications:              High risk colon cancer surveillance: Ulcerative                            pancolitis of 8 (or more) years duration Medicines:                Monitored Anesthesia Care Procedure:                Pre-Anesthesia Assessment:                           - Prior to the procedure, a History and Physical                            was performed, and patient medications and                            allergies were reviewed. The patient's tolerance of                            previous anesthesia was also reviewed. The risks                            and benefits of the procedure and the sedation                            options and risks were discussed with the patient.                            All questions were answered, and informed consent                            was obtained. Prior Anticoagulants: The patient has                            taken no previous anticoagulant or antiplatelet                            agents. ASA Grade Assessment: II - A patient with                            mild systemic disease. After reviewing the risks                            and benefits, the patient was deemed in                            satisfactory condition to undergo the procedure.  After obtaining informed consent, the colonoscope                            was passed under direct vision. Throughout the                            procedure, the patient's blood pressure, pulse, and                            oxygen saturations were monitored continuously. The                            Colonoscope was introduced through the anus and                            advanced to the the  terminal ileum, with                            identification of the appendiceal orifice and IC                            valve. The colonoscopy was performed without                            difficulty. The patient tolerated the procedure                            well. The quality of the bowel preparation was                            excellent. The terminal ileum, ileocecal valve,                            appendiceal orifice, and rectum were photographed. Scope In: 3:27:44 PM Scope Out: 3:49:07 PM Scope Withdrawal Time: 0 hours 19 minutes 16 seconds  Total Procedure Duration: 0 hours 21 minutes 23 seconds  Findings:                 The perianal and digital rectal examinations were                            normal.                           Inflammation characterized by altered vascularity,                            erythema, loss of vascularity, pseudopolyps and                            scarring was found in a continuous and                            circumferential pattern from the anus to the  hepatic flexure. The ascending colon and the cecum                            were spared. This was graded as Mayo Score 1 (mild,                            with erythema, decreased vascular pattern, mild                            friability). Biopsies were taken with a cold                            forceps for histology.                           A 9 mm polyp was found in the rectum. The polyp was                            pedunculated. The polyp was removed with a hot                            snare. Resection and retrieval were complete. To                            prevent bleeding after the polypectomy, one                            hemostatic clip was successfully placed (MR                            conditional). There was no bleeding at the end of                            the procedure. Complications:            No immediate  complications. Estimated Blood Loss:     Estimated blood loss was minimal. Impression:               - Ulcerative colitis, in remission. Inflammation                            was found from the anus to the hepatic flexure.                            This was graded as Mayo Score 1 (mild disease).                            Biopsied.                           - One 9 mm polyp in the rectum, removed with a hot                            snare. Resected and retrieved. Clip (MR  conditional) was placed. Recommendation:           - Patient has a contact number available for                            emergencies. The signs and symptoms of potential                            delayed complications were discussed with the                            patient. Return to normal activities tomorrow.                            Written discharge instructions were provided to the                            patient.                           - Resume previous diet.                           - Continue present medications.                           - Await pathology results.                           - Repeat colonoscopy in 2 years for surveillance                            based on pathology results.                           - Return to GI clinic at the next available                            appointment. Mauri Pole, MD 09/18/2017 3:58:14 PM This report has been signed electronically.

## 2017-09-18 NOTE — Progress Notes (Signed)
Called to room to assist during endoscopic procedure.  Patient ID and intended procedure confirmed with present staff. Received instructions for my participation in the procedure from the performing physician.  

## 2017-09-18 NOTE — Patient Instructions (Signed)
YOU HAD AN ENDOSCOPIC PROCEDURE TODAY AT [facility]:   Refer to the procedure report that was given to you for any specific questions about what was found during the examination.  If the procedure report does not answer your questions, please call your gastroenterologist to clarify.  If you requested that your care partner not be given the details of your procedure findings, then the procedure report has been included in a sealed envelope for you to review at your convenience later.  YOU SHOULD EXPECT: Some feelings of bloating in the abdomen. Passage of more gas than usual.  Walking can help get rid of the air that was put into your GI tract during the procedure and reduce the bloating. If you had a lower endoscopy (such as a colonoscopy or flexible sigmoidoscopy) you may notice spotting of blood in your stool or on the toilet paper. If you underwent a bowel prep for your procedure, you may not have a normal bowel movement for a few days.  Please Note:  You might notice some irritation and congestion in your nose or some drainage.  This is from the oxygen used during your procedure.  There is no need for concern and it should clear up in a day or so.  SYMPTOMS TO REPORT IMMEDIATELY:   Following lower endoscopy (colonoscopy or flexible sigmoidoscopy):  Excessive amounts of blood in the stool  Significant tenderness or worsening of abdominal pains  Swelling of the abdomen that is new, acute  Fever of 100F or higher   Following upper endoscopy (EGD)  Vomiting of blood or coffee ground material  New chest pain or pain under the shoulder blades  Painful or persistently difficult swallowing  New shortness of breath  Fever of 100F or higher  Black, tarry-looking stools  For urgent or emergent issues, a gastroenterologist can be reached at any hour by calling [phone].   DIET:  We do recommend a small meal at first, but then you may proceed to your regular diet.  Drink  plenty of fluids but you should avoid alcoholic beverages for 24 hours.  ACTIVITY:  You should plan to take it easy for the rest of today and you should NOT DRIVE or use heavy machinery until tomorrow (because of the sedation medicines used during the test).    FOLLOW UP: Our staff will call the number listed on your records the next business day following your procedure to check on you and address any questions or concerns that you may have regarding the information given to you following your procedure. If we do not reach you, we will leave a message.  However, if you are feeling well and you are not experiencing any problems, there is no need to return our call.  We will assume that you have returned to your regular daily activities without incident.  If any biopsies were taken you will be contacted by phone or by letter within the next 1-3 weeks.  Please call us at [phone] if you have not heard about the biopsies in 3 weeks.    SIGNATURES/CONFIDENTIALITY: You and/or your care partner have signed paperwork which will be entered into your electronic medical record.  These signatures attest to the fact that that the information above on your After Visit Summary has been reviewed and is understood.  Full responsibility of the confidentiality of this discharge information lies with you and/or your care-partner.YOU HAD AN ENDOSCOPIC PROCEDURE TODAY AT [facility]:  Refer to the procedure report that was given to you for any specific questions about what was found during the examination.  If the procedure report does not answer your questions, please call your gastroenterologist to clarify.  If you requested that your care partner not be given the details of your procedure findings, then the procedure report has been included in a sealed envelope for you to review at your convenience later.  YOU SHOULD EXPECT: Some feelings of bloating in the abdomen. Passage of more gas than  usual.  Walking can help get rid of the air that was put into your GI tract during the procedure and reduce the bloating. If you had a lower endoscopy (such as a colonoscopy or flexible sigmoidoscopy) you may notice spotting of blood in your stool or on the toilet paper. If you underwent a bowel prep for your procedure, you may not have a normal bowel movement for a few days.  Please Note:  You might notice some irritation and congestion in your nose or some drainage.  This is from the oxygen used during your procedure.  There is no need for concern and it should clear up in a day or so.  SYMPTOMS TO REPORT IMMEDIATELY:   Following lower endoscopy (colonoscopy or flexible sigmoidoscopy):  Excessive amounts of blood in the stool  Significant tenderness or worsening of abdominal pains  Swelling of the abdomen that is new, acute  Fever of 100F or higher   Following upper endoscopy (EGD)  Vomiting of blood or coffee ground material  New chest pain or pain under the shoulder blades  Painful or persistently difficult swallowing  New shortness of breath  Fever of 100F or higher  Black, tarry-looking stools  For urgent or emergent issues, a gastroenterologist can be reached at any hour by calling [phone].   DIET:  We do recommend a small meal at first, but then you may proceed to your regular diet.  Drink plenty of fluids but you should avoid alcoholic beverages for 24 hours.  ACTIVITY:  You should plan to take it easy for the rest of today and you should NOT DRIVE or use heavy machinery until tomorrow (because of the sedation medicines used during the test).    FOLLOW UP: Our staff will call the number listed on your records the next business day following your procedure to check on you and address any questions or concerns that you may have regarding the information given to you following your procedure. If we do not reach you, we will leave a message.  However, if you are feeling  well and you are not experiencing any problems, there is no need to return our call.  We will assume that you have returned to your regular daily activities without incident.  If any biopsies were taken you will be contacted by phone or by letter within the next 1-3 weeks.  Please call us at [phone] if you have not heard about the biopsies in 3 weeks.    SIGNATURES/CONFIDENTIALITY: You and/or your care partner have signed paperwork which will be entered into your electronic medical record.  These signatures attest to the fact that that the information above on your After Visit Summary has been reviewed and is understood.  Full responsibility of the confidentiality of this discharge information lies with you and/or your care-partner.  Return to GI clinic at next available appointment.  Recall colonoscopy in 2 years-2020.  Card given for clip placement.

## 2017-09-21 ENCOUNTER — Telehealth: Payer: Self-pay

## 2017-09-21 NOTE — Telephone Encounter (Signed)
  Follow up Call-  Call back number 09/18/2017  Post procedure Call Back phone  # [phone]  Permission to leave phone message Yes  Some recent data might be hidden     Patient questions:  Do you have a fever, pain , or abdominal swelling? No. Pain Score  0 *  Have you tolerated food without any problems? Yes.    Have you been able to return to your normal activities? Yes.    Do you have any questions about your discharge instructions: Diet   No. Medications  No. Follow up visit  No.  Do you have questions or concerns about your Care? No.  Actions: * If pain score is 4 or above: No action needed, pain <4.

## 2017-10-26 ENCOUNTER — Ambulatory Visit (INDEPENDENT_AMBULATORY_CARE_PROVIDER_SITE_OTHER): Payer: BLUE CROSS/BLUE SHIELD

## 2017-10-26 ENCOUNTER — Ambulatory Visit (INDEPENDENT_AMBULATORY_CARE_PROVIDER_SITE_OTHER): Payer: BLUE CROSS/BLUE SHIELD | Admitting: Family

## 2017-10-26 ENCOUNTER — Encounter (INDEPENDENT_AMBULATORY_CARE_PROVIDER_SITE_OTHER): Payer: Self-pay | Admitting: Family

## 2017-10-26 DIAGNOSIS — M7542 Impingement syndrome of left shoulder: Secondary | ICD-10-CM | POA: Diagnosis not present

## 2017-10-26 DIAGNOSIS — M25512 Pain in left shoulder: Secondary | ICD-10-CM

## 2017-10-26 DIAGNOSIS — G8929 Other chronic pain: Secondary | ICD-10-CM | POA: Diagnosis not present

## 2017-10-26 MED ORDER — LIDOCAINE HCL 1 % IJ SOLN
5.0000 mL | INTRAMUSCULAR | Status: AC | PRN
  Administered 2017-10-26: 5 mL

## 2017-10-26 MED ORDER — METHYLPREDNISOLONE ACETATE 40 MG/ML IJ SUSP
40.0000 mg | INTRAMUSCULAR | Status: AC | PRN
  Administered 2017-10-26: 40 mg via INTRA_ARTICULAR

## 2017-10-26 NOTE — Progress Notes (Signed)
Office Visit Note   Patient: [name]           Date of Birth: [birth year]           MRN: [id number] Visit Date: 10/26/2017              Requested by: No referring provider defined for this encounter. PCP: Leandrew Koyanagi, MD (Inactive)  No chief complaint on file.     HPI: Patient is a 56 year old gentleman seen today for evaluation of left shoulder pain. States fell off a roof this past spring and dislocated his left shoulder. States reduced himself and has pain since. Is concerned for RC tear. Has had shoulder surgery in the past with Dr. Marlou Sa. Pain with above head and behind back reaching. Some loss of ROM. Pain for greater than six months with little to no improvement.   Little relief with NSAIDs.  Eager to proceed with MRI, very concerned for tear, would like to proceed with surgical intervention before end of year if indicated.   Assessment & Plan: Visit Diagnoses:  1. Chronic left shoulder pain   2. Impingement syndrome of left shoulder     Plan: will trial a depo medrol injection left shoulder today. Proceed with MRI evaluate for tear. Will have him follow up with Dr. Marlou Sa for MRI review.  Follow-Up Instructions: No Follow-up on file.   Left Shoulder Exam   Tenderness  The patient is experiencing tenderness in the biceps tendon.  Range of Motion  The patient has normal left shoulder ROM.  Tests  Impingement: positive Drop arm: negative      Patient is alert, oriented, no adenopathy, well-dressed, normal affect, normal respiratory effort.   Imaging: No results found. No images are attached to the encounter.  Labs: Lab Results  Component Value Date   ESRSEDRATE 13 09/27/2015   ESRSEDRATE 22 09/28/2013   ESRSEDRATE 31 (H) 01/31/2013    Orders:  Orders Placed This Encounter  Procedures  . XR Shoulder Left   No orders of the defined types were placed in this encounter.    Procedures: Large Joint Inj: L subacromial bursa on  10/26/2017 2:23 PM Indications: pain Details: 22 G 1.5 in needle Medications: 5 mL lidocaine 1 %; 40 mg methylPREDNISolone acetate 40 MG/ML Consent was given by the patient.      Clinical Data: No additional findings.  ROS:  All other systems negative, except as noted in the HPI. Review of Systems  Constitutional: Negative for chills and fever.  Musculoskeletal: Positive for arthralgias.  Neurological: Negative for weakness and numbness.    Objective: Vital Signs: There were no vitals taken for this visit.  Specialty Comments:  No specialty comments available.  PMFS History: Patient Active Problem List   Diagnosis Date Noted  . UC (ulcerative colitis) ([place]) 08/18/2012  . ULCERATIVE COLITIS 04/11/2010   Past Medical History:  Diagnosis Date  . Adenomatous colon polyp   . Arthritis   . Iron deficiency anemia   . 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 12-14-14   . Ulcerative colitis     Family History  Problem Relation Age of Onset  . Colitis Mother   . Colon cancer Neg Hx     Past Surgical History:  Procedure Laterality Date  . COLONOSCOPY    . fracture left ankle    . KNEE ARTHROSCOPY     right  . rotator cuff right shoulder     Social History  Occupational History  . Occupation: tree service  Tobacco Use  . Smoking status: Former Smoker    Last attempt to quit: 04/22/1991    Years since quitting: 26.5  . Smokeless tobacco: Never Used  Substance and Sexual Activity  . Alcohol use: No  . Drug use: No  . Sexual activity: Not on file

## 2017-10-29 ENCOUNTER — Telehealth (INDEPENDENT_AMBULATORY_CARE_PROVIDER_SITE_OTHER): Payer: Self-pay | Admitting: Family

## 2017-10-29 NOTE — Telephone Encounter (Signed)
After speaking with Dr. Sharol Given I called the pt to advise that we will need to see him in the office first to be evaluated for knee problems. He will need x rays and then we can initiate pre cert for MRI of knees. He may have met his deductible but there is a process and requirements that need to be met in order to obtain approval. To call and make an appt.

## 2017-10-29 NOTE — Telephone Encounter (Signed)
Patient would like MRI to include his left and right knee at the same time as the left shoulder. Dr. Sharol Given did surgery on ACL of right knee but he feels like there might be some loose cartilage, locking up/sore. He talked to his insurance company and they said it was fine as long as you could put in another order since they are covering it 100%.  MRI appt is on Friday 11/06/17  Can you call patient once order is put in so he knows its taken care of?   [phone]

## 2017-11-02 ENCOUNTER — Ambulatory Visit (INDEPENDENT_AMBULATORY_CARE_PROVIDER_SITE_OTHER): Payer: BLUE CROSS/BLUE SHIELD | Admitting: Orthopedic Surgery

## 2017-11-02 ENCOUNTER — Ambulatory Visit (INDEPENDENT_AMBULATORY_CARE_PROVIDER_SITE_OTHER): Payer: BLUE CROSS/BLUE SHIELD

## 2017-11-02 ENCOUNTER — Encounter (INDEPENDENT_AMBULATORY_CARE_PROVIDER_SITE_OTHER): Payer: Self-pay | Admitting: Family

## 2017-11-02 VITALS — Ht 68.0 in | Wt 189.0 lb

## 2017-11-02 DIAGNOSIS — G8929 Other chronic pain: Secondary | ICD-10-CM | POA: Insufficient documentation

## 2017-11-02 DIAGNOSIS — M25561 Pain in right knee: Secondary | ICD-10-CM | POA: Insufficient documentation

## 2017-11-02 DIAGNOSIS — M25562 Pain in left knee: Secondary | ICD-10-CM

## 2017-11-02 NOTE — Progress Notes (Signed)
Office Visit Note   Patient: [name]           Date of Birth: May 20, 1961           MRN: [id number] Visit Date: 11/02/2017              Requested by: No referring provider defined for this encounter. PCP: Leandrew Koyanagi, MD (Inactive)  Chief Complaint  Patient presents with  . Right Knee - Pain  . Left Knee - Pain      HPI: Patient is a 56 year old gentleman who presents in follow-up with pain in both knees with mechanical symptoms of the right knee.  Patient states there is episodes where his knee locks in place he has to manipulate his knee and the knee becomes unlocked and he is able to resume his normal activities.  Patient states that he still is active with work.  He is status post traumatic dislocation to the left shoulder where he fell through some joists landed on the left shoulder he underwent a closed reduction at the time of the injury and reduce the shoulder.  Patient is scheduled for MRI left shoulder at this time.  Assessment & Plan: Visit Diagnoses:  1. Chronic pain of right knee   2. Chronic pain of left knee   3. Mechanical pain of right knee     Plan: Would recommend patient proceed with an MRI scan of the right knee as well with mechanical symptoms patient could benefit from arthroscopic intervention.  We will request an MRI scan of the right knee due to the mechanical locking and catching.  Follow-Up Instructions: Return in about 2 weeks (around 11/16/2017).   Ortho Exam  Patient is alert, oriented, no adenopathy, well-dressed, normal affect, normal respiratory effort. Examination patient has a normal gait.  There is crepitation with range of motion of both knees.  Medial and lateral joint lines are nontender to palpation he has approximately 2 mm of increased anterior drawer on the right compared to the left.  There is no redness no cellulitis no signs of infection.  I cannot elicit any mechanical locking in the office.  Imaging: Xr Knee 1-2  Views Left  Result Date: 11/02/2017 2 view radiographs of the left knee shows a congruent joint space with no subchondral sclerosis or cyst no periarticular bony spurs.  Xr Knee 1-2 Views Right  Result Date: 11/02/2017 2 view radiographs of the right knee shows previous screws from ACL reconstruction.  Patient is developing some medial joint line narrowing of the right knee.  There are no visible loose bodies no subchondral cyst or sclerosis.  There are no periarticular bony spurs.  No images are attached to the encounter.  Labs: Lab Results  Component Value Date   ESRSEDRATE 13 09/27/2015   ESRSEDRATE 22 09/28/2013   ESRSEDRATE 31 (H) 01/31/2013    Orders:  Orders Placed This Encounter  Procedures  . XR Knee 1-2 Views Right  . XR Knee 1-2 Views Left  . MR Knee Right w/o contrast   No orders of the defined types were placed in this encounter.    Procedures: No procedures performed  Clinical Data: No additional findings.  ROS:  All other systems negative, except as noted in the HPI. Review of Systems  Objective: Vital Signs: Ht 5' 8"  (1.727 m)   Wt 189 lb (85.7 kg)   BMI 28.74 kg/m   Specialty Comments:  No specialty comments available.  PMFS History: Patient Active Problem List  Diagnosis Date Noted  . Chronic pain of right knee 11/02/2017  . Chronic pain of left knee 11/02/2017  . Mechanical pain of right knee 11/02/2017  . UC (ulcerative colitis) ([place]) 08/18/2012  . ULCERATIVE COLITIS 04/11/2010   Past Medical History:  Diagnosis Date  . Adenomatous colon polyp   . Arthritis   . Iron deficiency anemia   . 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 12-14-14   . Ulcerative colitis     Family History  Problem Relation Age of Onset  . Colitis Mother   . Colon cancer Neg Hx     Past Surgical History:  Procedure Laterality Date  . COLONOSCOPY    . fracture left ankle    . KNEE ARTHROSCOPY     right  . rotator cuff right shoulder      Social History   Occupational History  . Occupation: tree service  Tobacco Use  . Smoking status: Former Smoker    Last attempt to quit: 04/22/1991    Years since quitting: 26.5  . Smokeless tobacco: Never Used  Substance and Sexual Activity  . Alcohol use: No  . Drug use: No  . Sexual activity: Not on file

## 2017-11-06 ENCOUNTER — Ambulatory Visit (HOSPITAL_COMMUNITY)
Admission: RE | Admit: 2017-11-06 | Discharge: 2017-11-06 | Disposition: A | Discharge status: Home / Self Care | Payer: BLUE CROSS/BLUE SHIELD | Source: Ambulatory Visit | Attending: Family | Admitting: Family

## 2017-11-06 DIAGNOSIS — G8929 Other chronic pain: Secondary | ICD-10-CM

## 2017-11-06 DIAGNOSIS — M75102 Unspecified rotator cuff tear or rupture of left shoulder, not specified as traumatic: Secondary | ICD-10-CM | POA: Insufficient documentation

## 2017-11-06 DIAGNOSIS — M25512 Pain in left shoulder: Secondary | ICD-10-CM | POA: Diagnosis present

## 2017-11-06 DIAGNOSIS — M13812 Other specified arthritis, left shoulder: Secondary | ICD-10-CM | POA: Diagnosis not present

## 2017-11-09 ENCOUNTER — Ambulatory Visit (HOSPITAL_COMMUNITY)
Admission: RE | Admit: 2017-11-09 | Discharge: 2017-11-09 | Disposition: A | Discharge status: Home / Self Care | Payer: BLUE CROSS/BLUE SHIELD | Source: Ambulatory Visit | Attending: Orthopedic Surgery | Admitting: Orthopedic Surgery

## 2017-11-09 DIAGNOSIS — G8929 Other chronic pain: Secondary | ICD-10-CM | POA: Diagnosis present

## 2017-11-09 DIAGNOSIS — Z9889 Other specified postprocedural states: Secondary | ICD-10-CM | POA: Insufficient documentation

## 2017-11-09 DIAGNOSIS — M25561 Pain in right knee: Secondary | ICD-10-CM | POA: Diagnosis present

## 2017-11-09 DIAGNOSIS — M67461 Ganglion, right knee: Secondary | ICD-10-CM | POA: Diagnosis not present

## 2017-11-10 ENCOUNTER — Ambulatory Visit (HOSPITAL_COMMUNITY): Admission: RE | Admit: 2017-11-10 | Payer: BLUE CROSS/BLUE SHIELD | Source: Ambulatory Visit

## 2017-11-12 ENCOUNTER — Encounter (INDEPENDENT_AMBULATORY_CARE_PROVIDER_SITE_OTHER): Payer: Self-pay | Admitting: Orthopedic Surgery

## 2017-11-12 ENCOUNTER — Ambulatory Visit (INDEPENDENT_AMBULATORY_CARE_PROVIDER_SITE_OTHER): Payer: BLUE CROSS/BLUE SHIELD | Admitting: Orthopedic Surgery

## 2017-11-12 DIAGNOSIS — M2341 Loose body in knee, right knee: Secondary | ICD-10-CM | POA: Diagnosis not present

## 2017-11-12 DIAGNOSIS — M7542 Impingement syndrome of left shoulder: Secondary | ICD-10-CM | POA: Insufficient documentation

## 2017-11-12 NOTE — Progress Notes (Signed)
Office Visit Note   Patient: [name]           Date of Birth: May 16, 1961           MRN: [id number] Visit Date: 11/12/2017              Requested by: No referring provider defined for this encounter. PCP: Leandrew Koyanagi, MD (Inactive)  Chief Complaint  Patient presents with  . Right Knee - Pain      HPI: Patient is a 56 year old gentleman who presents for 2 separate issues #1 he has had a 1 year history of mechanical catching and locking of the right knee sometimes he is completely asymptomatic sometimes he cannot do any of his activities due to locking of his knee.  Patient is also status post traumatic injury and fall at work he had a dislocation of the left shoulder and underwent closed reduction at the time of the injury patient still has persistent pain in the left shoulder and is seen in follow-up status post MRI scan of the left shoulder and right knee.  He did have some temporary relief with a steroid injection.  Assessment & Plan: Visit Diagnoses:  1. Loose body in knee, right   2. Impingement syndrome of left shoulder     Plan: Discussed with the patient his options for the right knee and left shoulder are to consider arthroscopic intervention discussed with arthroscopy of the right knee with could remove the loose body.  Discussed that he would still have the arthritic changes in the knee.  Also discussed with the shoulder that we could proceed with subacromial decompression and debridement of the partial rotator cuff tear.  Discussed that this would be his decision depending on how much these both affect his activities of daily living.  Risks and benefits of surgery were discussed patient states he understands he will discussed this at home and will call Malachy Mood if he is ready to schedule surgery before the end of the year.  Follow-Up Instructions: Return if symptoms worsen or fail to improve.   Ortho Exam  Patient is alert, oriented, no adenopathy,  well-dressed, normal affect, normal respiratory effort. Examination patient has abduction flexion about 110 degrees of the left shoulder he has pain with Neer and Hawkins impingement test pain to palpation of the biceps tendon.  Semination the right knee does have good range of motion there is some popping with the range of motion collaterals and cruciates are stable.  Review of the MRI scan of the right knee shows arthritis as well as a Nischal cyst and a loose body.  The loose body is currently of posterior to the posterior horn of the medial meniscus.  Examination left shoulder MRI scan patient has a partial-thickness tearing of the insertion of the rotator cuff.  Imaging: No results found. No images are attached to the encounter.  Labs: Lab Results  Component Value Date   ESRSEDRATE 13 09/27/2015   ESRSEDRATE 22 09/28/2013   ESRSEDRATE 31 (H) 01/31/2013    @LABSALLVALUES (HGBA1)@  @BMI1 @  Orders:  No orders of the defined types were placed in this encounter.  No orders of the defined types were placed in this encounter.    Procedures: No procedures performed  Clinical Data: No additional findings.  ROS:  All other systems negative, except as noted in the HPI. Review of Systems  Objective: Vital Signs: There were no vitals taken for this visit.  Specialty Comments:  No specialty comments available.  PMFS History: Patient Active Problem List   Diagnosis Date Noted  . Loose body in knee, right 11/12/2017  . Impingement syndrome of left shoulder 11/12/2017  . Chronic pain of right knee 11/02/2017  . Chronic pain of left knee 11/02/2017  . Mechanical pain of right knee 11/02/2017  . UC (ulcerative colitis) ([place]) 08/18/2012  . ULCERATIVE COLITIS 04/11/2010   Past Medical History:  Diagnosis Date  . Adenomatous colon polyp   . Arthritis   . Iron deficiency anemia   . 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 12-14-14   . Ulcerative colitis     Family  History  Problem Relation Age of Onset  . Colitis Mother   . Colon cancer Neg Hx     Past Surgical History:  Procedure Laterality Date  . COLONOSCOPY    . fracture left ankle    . KNEE ARTHROSCOPY     right  . rotator cuff right shoulder     Social History   Occupational History  . Occupation: tree service  Tobacco Use  . Smoking status: Former Smoker    Last attempt to quit: 04/22/1991    Years since quitting: 26.5  . Smokeless tobacco: Never Used  Substance and Sexual Activity  . Alcohol use: No  . Drug use: No  . Sexual activity: Not on file

## 2017-12-01 DIAGNOSIS — S83211A Bucket-handle tear of medial meniscus, current injury, right knee, initial encounter: Secondary | ICD-10-CM | POA: Diagnosis not present

## 2017-12-01 DIAGNOSIS — M75122 Complete rotator cuff tear or rupture of left shoulder, not specified as traumatic: Secondary | ICD-10-CM | POA: Diagnosis not present

## 2017-12-17 ENCOUNTER — Encounter (INDEPENDENT_AMBULATORY_CARE_PROVIDER_SITE_OTHER): Payer: Self-pay | Admitting: Orthopedic Surgery

## 2017-12-17 ENCOUNTER — Ambulatory Visit (INDEPENDENT_AMBULATORY_CARE_PROVIDER_SITE_OTHER): Payer: BLUE CROSS/BLUE SHIELD | Admitting: Orthopedic Surgery

## 2017-12-17 DIAGNOSIS — M7542 Impingement syndrome of left shoulder: Secondary | ICD-10-CM

## 2017-12-17 DIAGNOSIS — M25561 Pain in right knee: Secondary | ICD-10-CM

## 2017-12-17 NOTE — Progress Notes (Signed)
Office Visit Note   Patient: [name]           Date of Birth: 12-26-1960           MRN: [id number] Visit Date: 12/17/2017              Requested by: No referring provider defined for this encounter. PCP: Leandrew Koyanagi, MD (Inactive)  Chief Complaint  Patient presents with  . Left Shoulder - Pain  . Right Knee - Pain      HPI: Patient presents in follow-up for left shoulder arthroscopy with debridement and decompression as well as right knee arthroscopic debridement and partial meniscectomy.  Patient states that he has excellent strength with his left shoulder and excellent range of motion of the right knee he states he still has some swelling in the right knee.  Assessment & Plan: Visit Diagnoses:  1. Impingement syndrome of left shoulder   2. Mechanical pain of right knee     Plan: Patient will work on scapular stabilization strengthening this was demonstrated to him he was able to repeat the demonstration for stabilization for both shoulders as well as internal and external rotation strengthening.  Patient was able to do several push-ups without any pain or instability.  Patient was given instructions for quad isometric straight leg raises as well as step up strengthening for the right knee.  Sutures harvested today he has not taken any pain medication he will increase his activities as tolerated.  Follow-Up Instructions: Return in about 4 weeks (around 01/14/2018).   Ortho Exam  Patient is alert, oriented, no adenopathy, well-dressed, normal affect, normal respiratory effort. Examination patient has good stable pain-free range of motion of both the knee and shoulder.  The portals are clean and dry no signs of infection we will harvest the sutures.  Patient was demonstrated instructions for strengthening of the left shoulder and right knee.  He has been active postoperatively and is able to do a squat without symptoms and push-ups without symptoms.  Imaging: No  results found. No images are attached to the encounter.  Labs: Lab Results  Component Value Date   ESRSEDRATE 13 09/27/2015   ESRSEDRATE 22 09/28/2013   ESRSEDRATE 31 (H) 01/31/2013    @LABSALLVALUES (HGBA1)@  There is no height or weight on file to calculate BMI.  Orders:  No orders of the defined types were placed in this encounter.  No orders of the defined types were placed in this encounter.    Procedures: No procedures performed  Clinical Data: No additional findings.  ROS:  All other systems negative, except as noted in the HPI. Review of Systems  Objective: Vital Signs: There were no vitals taken for this visit.  Specialty Comments:  No specialty comments available.  PMFS History: Patient Active Problem List   Diagnosis Date Noted  . Loose body in knee, right 11/12/2017  . Impingement syndrome of left shoulder 11/12/2017  . Chronic pain of right knee 11/02/2017  . Chronic pain of left knee 11/02/2017  . Mechanical pain of right knee 11/02/2017  . UC (ulcerative colitis) ([place]) 08/18/2012  . ULCERATIVE COLITIS 04/11/2010   Past Medical History:  Diagnosis Date  . Adenomatous colon polyp   . Arthritis   . Iron deficiency anemia   . 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 12-14-14   . Ulcerative colitis     Family History  Problem Relation Age of Onset  . Colitis Mother   . Colon  cancer Neg Hx     Past Surgical History:  Procedure Laterality Date  . COLONOSCOPY    . fracture left ankle    . KNEE ARTHROSCOPY     right  . rotator cuff right shoulder     Social History   Occupational History  . Occupation: tree service  Tobacco Use  . Smoking status: Former Smoker    Last attempt to quit: 04/22/1991    Years since quitting: 26.6  . Smokeless tobacco: Never Used  Substance and Sexual Activity  . Alcohol use: No  . Drug use: No  . Sexual activity: Not on file

## 2017-12-18 ENCOUNTER — Encounter: Payer: Self-pay | Admitting: Gastroenterology

## 2018-01-19 ENCOUNTER — Ambulatory Visit (INDEPENDENT_AMBULATORY_CARE_PROVIDER_SITE_OTHER): Payer: BLUE CROSS/BLUE SHIELD | Admitting: Orthopedic Surgery

## 2018-02-01 ENCOUNTER — Telehealth: Payer: Self-pay

## 2018-02-01 NOTE — Telephone Encounter (Signed)
I will take care of this. Please advise of the labs you will order. Thanks!

## 2018-02-02 ENCOUNTER — Other Ambulatory Visit: Payer: Self-pay

## 2018-02-02 DIAGNOSIS — K51218 Ulcerative (chronic) proctitis with other complication: Secondary | ICD-10-CM

## 2018-02-02 DIAGNOSIS — Z79899 Other long term (current) drug therapy: Secondary | ICD-10-CM

## 2018-02-02 NOTE — Telephone Encounter (Signed)
Please check, CBC, CMP, CRP, iron panel, S11, folic acid and TB quantiferon gold. Thanks

## 2018-02-02 NOTE — Telephone Encounter (Signed)
All labs entered into Epic.

## 2018-02-03 ENCOUNTER — Other Ambulatory Visit (INDEPENDENT_AMBULATORY_CARE_PROVIDER_SITE_OTHER): Payer: BLUE CROSS/BLUE SHIELD

## 2018-02-03 DIAGNOSIS — K51218 Ulcerative (chronic) proctitis with other complication: Secondary | ICD-10-CM | POA: Diagnosis not present

## 2018-02-03 DIAGNOSIS — Z79899 Other long term (current) drug therapy: Secondary | ICD-10-CM

## 2018-02-03 LAB — COMPREHENSIVE METABOLIC PANEL
ALT: 22 U/L (ref 0–53)
AST: 20 U/L (ref 0–37)
Albumin: 4.2 g/dL (ref 3.5–5.2)
Alkaline Phosphatase: 82 U/L (ref 39–117)
BUN: 19 mg/dL (ref 6–23)
CHLORIDE: 101 meq/L (ref 96–112)
CO2: 29 meq/L (ref 19–32)
Calcium: 9.4 mg/dL (ref 8.4–10.5)
Creatinine, Ser: 0.88 mg/dL (ref 0.40–1.50)
GFR: 95.67 mL/min (ref >60.00)
GLUCOSE: 96 mg/dL (ref 70–99)
POTASSIUM: 4.2 meq/L (ref 3.5–5.1)
SODIUM: 136 meq/L (ref 135–145)
Total Bilirubin: 0.7 mg/dL (ref 0.2–1.2)
Total Protein: 7.6 g/dL (ref 6.0–8.3)

## 2018-02-03 LAB — CBC WITH DIFFERENTIAL/PLATELET
Basophils Absolute: 0.1 10*3/uL (ref 0.0–0.1)
Basophils Relative: 0.9 % (ref 0.0–3.0)
EOS PCT: 4.3 % (ref 0.0–5.0)
Eosinophils Absolute: 0.3 10*3/uL (ref 0.0–0.7)
HCT: 48.6 % (ref 39.0–52.0)
Hemoglobin: 16.2 g/dL (ref 13.0–17.0)
LYMPHS ABS: 1.7 10*3/uL (ref 0.7–4.0)
Lymphocytes Relative: 21.2 % (ref 12.0–46.0)
MCHC: 33.2 g/dL (ref 30.0–36.0)
MCV: 88.7 fl (ref 78.0–100.0)
MONO ABS: 0.7 10*3/uL (ref 0.1–1.0)
MONOS PCT: 8.4 % (ref 3.0–12.0)
NEUTROS ABS: 5.3 10*3/uL (ref 1.4–7.7)
NEUTROS PCT: 65.2 % (ref 43.0–77.0)
PLATELETS: 240 10*3/uL (ref 150.0–400.0)
RBC: 5.48 Mil/uL (ref 4.22–5.81)
RDW: 13.9 % (ref 11.5–15.5)
WBC: 8.2 10*3/uL (ref 4.0–10.5)

## 2018-02-03 LAB — B12 AND FOLATE PANEL: Vitamin B-12: 745 pg/mL (ref 211–911)

## 2018-02-03 LAB — C-REACTIVE PROTEIN: CRP: 0.5 mg/dL (ref 0.5–20.0)

## 2018-02-05 LAB — IRON,TIBC AND FERRITIN PANEL
%SAT: 29 % (calc) (ref 15–60)
Ferritin: 120 ng/mL (ref 20–380)
Iron: 85 ug/dL (ref 50–180)
TIBC: 295 ug/dL (ref 250–425)

## 2018-02-05 LAB — QUANTIFERON-TB GOLD PLUS
Mitogen-NIL: >10 IU/mL
NIL: 0.01 IU/mL
QuantiFERON-TB Gold Plus: NEGATIVE
TB1-NIL: 0 IU/mL
TB2-NIL: 0.02 IU/mL

## 2018-03-22 ENCOUNTER — Other Ambulatory Visit: Payer: Self-pay

## 2018-03-22 ENCOUNTER — Telehealth: Payer: Self-pay | Admitting: Gastroenterology

## 2018-03-22 MED ORDER — ADALIMUMAB 40 MG/0.8ML ~~LOC~~ AJKT: 1.0000 "pen " | AUTO-INJECTOR | SUBCUTANEOUS | 0 refills | Status: DC

## 2018-03-22 NOTE — Telephone Encounter (Signed)
[facility] calling for rf on humira pen. Fax number [phone].

## 2018-03-24 ENCOUNTER — Telehealth: Payer: Self-pay | Admitting: Gastroenterology

## 2018-03-24 NOTE — Telephone Encounter (Signed)
Patient has 2 specialty pharmacies listed in his chart. He is going to check on this first. He will get back to me with confirmation on which pharmacy. Needs a refill. Confirmed his appointment on 03/31/18.

## 2018-03-24 NOTE — Telephone Encounter (Signed)
Pt would like to speak with you regarding his med humira.

## 2018-03-29 ENCOUNTER — Other Ambulatory Visit: Payer: Self-pay

## 2018-03-29 ENCOUNTER — Telehealth: Payer: Self-pay | Admitting: Gastroenterology

## 2018-03-29 MED ORDER — ADALIMUMAB 40 MG/0.8ML ~~LOC~~ AJKT: 1.0000 "pen " | AUTO-INJECTOR | SUBCUTANEOUS | 3 refills | Status: DC

## 2018-03-29 NOTE — Telephone Encounter (Signed)
Patient calls back to confirm he is using the [facility] for his [name].

## 2018-03-29 NOTE — Telephone Encounter (Signed)
Refill submitted to [facility].

## 2018-03-31 ENCOUNTER — Encounter: Payer: Self-pay | Admitting: Gastroenterology

## 2018-03-31 ENCOUNTER — Ambulatory Visit: Payer: BLUE CROSS/BLUE SHIELD | Admitting: Gastroenterology

## 2018-03-31 ENCOUNTER — Other Ambulatory Visit: Payer: Self-pay

## 2018-03-31 VITALS — BP 110/80 | HR 80 | Ht 67.32 in | Wt 191.1 lb

## 2018-03-31 DIAGNOSIS — K5190 Ulcerative colitis, unspecified, without complications: Secondary | ICD-10-CM

## 2018-03-31 MED ORDER — ADALIMUMAB 40 MG/0.4ML ~~LOC~~ PSKT: 1.0000 "pen " | PREFILLED_SYRINGE | SUBCUTANEOUS | 11 refills | Status: DC

## 2018-03-31 NOTE — Patient Instructions (Addendum)
If you are age 55 or older, your body mass index should be between 23-30. Your Body mass index is 29.65 kg/m. If this is out of the aforementioned range listed, please consider follow up with your Primary Care Provider.  If you are age 52 or younger, your body mass index should be between 19-25. Your Body mass index is 29.65 kg/m. If this is out of the aformentioned range listed, please consider follow up with your Primary Care Provider.   Come in Monday morning 04/05/2018 before your [name] Injection to pick up your [employer] Drug Monitoring Kit. Then go downstairs to have your labs draw. Follow up in 6 months with Dr. Silverio Decamp  Thank you for choosing [facility], Dr. Silverio Decamp

## 2018-03-31 NOTE — Progress Notes (Signed)
Corben Auzenne    [id number]    22-Feb-1961  Primary Care Physician:Doolittle, Linton Ham, MD (Inactive)  Referring Physician: No referring provider defined for this encounter.  Chief complaint: Ulcerative colitis  HPI:  57 year old male with history of pancolonic ulcerative colitis initially diagnosed in 1995 currently in remission on [name] is here for routine annual visit.  He is doing well overall on [name] and has not had any recent flares.  Denies any diarrhea, increased bowel frequency, abdominal pain, skin rash, joint pain or blood per rectum.  He uses occasional Canasa suppositories when he notices increased mucus discharge.  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 Reviewed recent labs from February 2019, CBC, CMP, iron panel, ferritin, folate, B12 and CRP within normal limits. TB QuantiFERON gold negative Colonoscopy September 18, 2017 showed altered vascular pattern, inactive colitis, 9 mm polyp removed from rectum with hot snare. Based on pathology, biopsies from the left colon showed minimally active colitis.  Right colon biopsies showed normal mucosa.  The polyp was hyperplastic.  Recall colonoscopy in 2 years for surveillance  Outpatient Encounter Medications as of 03/31/2018  Medication Sig  . Adalimumab ([name] PEN) 40 MG/0.8ML PNKT Inject 1 pen into the skin once a week.  . cyanocobalamin 500 MCG tablet Take 500 mcg by mouth as needed.  . mesalamine (CANASA) 1000 MG suppository Place 1 suppository (1,000 mg total) rectally at bedtime.  . Probiotic Product (PROBIOTIC-10) CAPS Take by mouth daily.   No facility-administered encounter medications on file as of 03/31/2018.     Allergies as of 03/31/2018  . (No Known Allergies)    Past Medical History:  Diagnosis Date  . Adenomatous colon polyp   . Arthritis   . Iron deficiency anemia   . 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 12-14-14   . Ulcerative colitis     Past  Surgical History:  Procedure Laterality Date  . COLONOSCOPY    . fracture left ankle    . KNEE ARTHROSCOPY     right  . rotator cuff right shoulder      Family History  Problem Relation Age of Onset  . Colitis Mother   . Colon cancer Neg Hx     Social History   Socioeconomic History  . Marital status: Married    Spouse name: Not on file  . Number of children: 4  . Years of education: Not on file  . Highest education level: Not on file  Occupational History  . Occupation: tree service  Social Needs  . Financial resource strain: Not on file  . Food insecurity:    Worry: Not on file    Inability: Not on file  . Transportation needs:    Medical: Not on file    Non-medical: Not on file  Tobacco Use  . Smoking status: Former Smoker    Last attempt to quit: 04/22/1991    Years since quitting: 26.9  . Smokeless tobacco: Never Used  Substance and Sexual Activity  . Alcohol use: No  . Drug use: No  . Sexual activity: Not on file  Lifestyle  . Physical activity:    Days per week: Not on file    Minutes per session: Not on file  . Stress: Not on file  Relationships  . Social connections:    Talks on phone: Not on file    Gets together: Not on file  Attends religious service: Not on file    Active member of club or organization: Not on file    Attends meetings of clubs or organizations: Not on file    Relationship status: Not on file  . Intimate partner violence:    Fear of current or ex partner: Not on file    Emotionally abused: Not on file    Physically abused: Not on file    Forced sexual activity: Not on file  Other Topics Concern  . Not on file  Social History Narrative  . Not on file      Review of systems: Review of Systems  Constitutional: Negative for fever and chills.  HENT: Negative.   Eyes: Negative for blurred vision.  Respiratory: Negative for cough, shortness of breath and wheezing.   Cardiovascular: Negative for chest pain and  palpitations.  Gastrointestinal: as per HPI Genitourinary: Negative for dysuria, urgency, frequency and hematuria.  Musculoskeletal: Negative for myalgias, back pain and joint pain.  Skin: Negative for itching and rash.  Neurological: Negative for dizziness, tremors, focal weakness, seizures and loss of consciousness.  Endo/Heme/Allergies: Negative for seasonal allergies.  Psychiatric/Behavioral: Negative for depression, suicidal ideas and hallucinations.  All other systems reviewed and are negative.   Physical Exam: Vitals:   03/31/18 1041  BP: 110/80  Pulse: 80   Body mass index is 29.65 kg/m. Gen:      No acute distress HEENT:  EOMI, sclera anicteric Neck:     No masses; no thyromegaly Lungs:    Clear to auscultation bilaterally; normal respiratory effort CV:         Regular rate and rhythm; no murmurs Abd:      + bowel sounds; soft, non-tender; no palpable masses, no distension Ext:    No edema; adequate peripheral perfusion Skin:      Warm and dry; no rash Neuro: alert and oriented x 3 Psych: normal mood and affect  Data Reviewed:  Reviewed labs, radiology imaging, old records and pertinent past GI work up  Colonoscopy September 18, 2017 - The perianal and digital rectal examinations were normal. - Inflammation characterized by altered vascularity, erythema, loss of vascularity, pseudopolyps and scarring was found in a continuous and circumferential pattern from the anus to the hepatic flexure. The ascending colon and the cecum were spared. This was graded as Mayo Score 1 (mild, with erythema, decreased vascular pattern, mild friability). Biopsies were taken with a cold forceps for histology. - A 9 mm polyp was found in the rectum. The polyp was pedunculated. The polyp was removed with a hot snare. Resection and retrieval were complete. To prevent bleeding after the polypectomy, one hemostatic clip was successfully placed (MR conditional). There was no bleeding at the  end of the procedure. 1. Surgical [P], ascending and cecum - BENIGN COLONIC MUCOSA. - NO ACTIVE INFLAMMATION. - NO DYSPLASIA OR MALIGNANCY. 2. Surgical [P], transverse - BENIGN COLONIC MUCOSA. - NO ACTIVE INFLAMMATION. - NO DYSPLASIA OR MALIGNANCY. 3. Surgical [P], sigmoid and descending - CHRONIC MILDLY ACTIVE COLITIS. - NO DYSPLASIA OR MALIGNANCY. 4. Surgical [P], rectum - CHRONIC MINIMALLY ACTIVE COLITIS. - NO DYSPLASIA OR MALIGNANCY. 5. Surgical [P], rectum, polyp - INFLAMED HYPERPLASTIC POLYP. - NO DYSPLASIA OR MALIGNANCY.  Assessment and Plan/Recommendations:  57 year old male with history of ulcerative colitis, pancolonic initially diagnosed in 1995, currently in clinical remission on [name] is here for follow-up visit Most recent colonoscopy with random biopsies showed minimally active colitis and left colon Continue current regimen Will check [name]  drug trough and antibody level and see if he needs any medication adjustment to improve left-sided inflammation noted on biopsies Return in 6 months or sooner if needed  25 minutes was spent face-to-face with the patient. Greater than 50% of the time used for counseling as well as treatment plan and follow-up. He had multiple questions which were answered to his satisfaction  K. Denzil Magnuson , MD [phone]    CC: No ref. provider found

## 2018-04-12 ENCOUNTER — Encounter: Payer: Self-pay | Admitting: Gastroenterology

## 2018-04-29 ENCOUNTER — Other Ambulatory Visit: Payer: Self-pay

## 2018-04-29 ENCOUNTER — Telehealth: Payer: Self-pay | Admitting: Gastroenterology

## 2018-04-29 NOTE — Telephone Encounter (Signed)
Called patient to inform results. Undetectable drug level with elevated antibody level >160 for [name]  He reports not doing the  injections regularly and it may have been 3 weeks before drug trough was tested.  Will recheck drug trough and Ab level for [name] next Friday 05/07/18 and advised him to continue  the injection every week Follow up in office visit  K. Denzil Magnuson , MD [phone]

## 2018-05-03 ENCOUNTER — Emergency Department (HOSPITAL_COMMUNITY)
Admission: EM | Admit: 2018-05-03 | Discharge: 2018-05-03 | Disposition: A | Discharge status: Home / Self Care | Payer: BLUE CROSS/BLUE SHIELD | Attending: Emergency Medicine | Admitting: Emergency Medicine

## 2018-05-03 ENCOUNTER — Emergency Department (HOSPITAL_COMMUNITY): Payer: BLUE CROSS/BLUE SHIELD

## 2018-05-03 ENCOUNTER — Other Ambulatory Visit: Payer: Self-pay

## 2018-05-03 ENCOUNTER — Encounter (HOSPITAL_COMMUNITY): Payer: Self-pay

## 2018-05-03 DIAGNOSIS — Z79899 Other long term (current) drug therapy: Secondary | ICD-10-CM | POA: Insufficient documentation

## 2018-05-03 DIAGNOSIS — Z87891 Personal history of nicotine dependence: Secondary | ICD-10-CM | POA: Diagnosis not present

## 2018-05-03 DIAGNOSIS — Y9289 Other specified places as the place of occurrence of the external cause: Secondary | ICD-10-CM | POA: Diagnosis not present

## 2018-05-03 DIAGNOSIS — Z23 Encounter for immunization: Secondary | ICD-10-CM | POA: Diagnosis not present

## 2018-05-03 DIAGNOSIS — W231XXA Caught, crushed, jammed, or pinched between stationary objects, initial encounter: Secondary | ICD-10-CM | POA: Diagnosis not present

## 2018-05-03 DIAGNOSIS — Y9389 Activity, other specified: Secondary | ICD-10-CM | POA: Insufficient documentation

## 2018-05-03 DIAGNOSIS — Y999 Unspecified external cause status: Secondary | ICD-10-CM | POA: Diagnosis not present

## 2018-05-03 DIAGNOSIS — S61511A Laceration without foreign body of right wrist, initial encounter: Secondary | ICD-10-CM

## 2018-05-03 DIAGNOSIS — S66821A Laceration of other specified muscles, fascia and tendons at wrist and hand level, right hand, initial encounter: Secondary | ICD-10-CM | POA: Diagnosis not present

## 2018-05-03 MED ORDER — LIDOCAINE HCL 2 % IJ SOLN
20.0000 mL | Freq: Once | INTRAMUSCULAR | Status: AC
  Administered 2018-05-03: 400 mg
  Filled 2018-05-03: qty 20

## 2018-05-03 MED ORDER — CEPHALEXIN 500 MG PO CAPS: 500.0000 mg | ORAL_CAPSULE | Freq: Two times a day (BID) | ORAL | 0 refills | Status: DC

## 2018-05-03 MED ORDER — CEPHALEXIN 250 MG PO CAPS
500.0000 mg | ORAL_CAPSULE | Freq: Once | ORAL | Status: AC
  Administered 2018-05-03: 500 mg via ORAL
  Filled 2018-05-03: qty 2

## 2018-05-03 MED ORDER — TETANUS-DIPHTH-ACELL PERTUSSIS 5-2.5-18.5 LF-MCG/0.5 IM SUSP
0.5000 mL | Freq: Once | INTRAMUSCULAR | Status: AC
  Administered 2018-05-03: 0.5 mL via INTRAMUSCULAR
  Filled 2018-05-03: qty 0.5

## 2018-05-03 NOTE — ED Provider Notes (Signed)
[facility] Provider Note   CSN: [id number] Arrival date & time: 05/03/18  1708   History   Chief Complaint Chief Complaint  Patient presents with  . Hand Injury    HPI [name] is a 57 y.o. male.  HPI   57 year old right hand dominant male presents today with laceration to his right wrist.  Patient notes he was using a piece of equipment when his wrist got caught in between a fence.  He notes laceration and slight pain at that area.  He denies any loss of sensation strength and motor function of the hand wrist or fingers.  Patient was seen as an outpatient at Dr. due to's office with recommendation for imaging here in the ED.  Tetanus status unknown.  Past Medical History:  Diagnosis Date  . Adenomatous colon polyp   . Arthritis   . Iron deficiency anemia   . 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 12-14-14   . Ulcerative colitis     Patient Active Problem List   Diagnosis Date Noted  . Loose body in knee, right 11/12/2017  . Impingement syndrome of left shoulder 11/12/2017  . Chronic pain of right knee 11/02/2017  . Chronic pain of left knee 11/02/2017  . Mechanical pain of right knee 11/02/2017  . UC (ulcerative colitis) ([place]) 08/18/2012  . ULCERATIVE COLITIS 04/11/2010    Past Surgical History:  Procedure Laterality Date  . COLONOSCOPY    . fracture left ankle    . KNEE ARTHROSCOPY     right  . rotator cuff right shoulder       Home Medications    Prior to Admission medications   Medication Sig Start Date End Date Taking? Authorizing Provider  Adalimumab ([name]) 40 MG/0.4ML PSKT Inject 1 pen into the skin once a week. 03/31/18   Mauri Pole, MD  cephALEXin (KEFLEX) 500 MG capsule Take 1 capsule (500 mg total) by mouth 2 (two) times daily. 05/03/18   Eara Burruel, Dellis Filbert, PA-C  cyanocobalamin 500 MCG tablet Take 500 mcg by mouth as needed.    [provider]  mesalamine (CANASA) 1000 MG  suppository Place 1 suppository (1,000 mg total) rectally at bedtime. 04/27/17   Mauri Pole, MD  Probiotic Product (PROBIOTIC-10) CAPS Take by mouth daily.    [provider]    Family History Family History  Problem Relation Age of Onset  . Colitis Mother   . Colon cancer Neg Hx     Social History Social History   Tobacco Use  . Smoking status: Former Smoker    Last attempt to quit: 04/22/1991    Years since quitting: 27.0  . Smokeless tobacco: Never Used  Substance Use Topics  . Alcohol use: No  . Drug use: No     Allergies   Patient has no known allergies.   Review of Systems Review of Systems  All other systems reviewed and are negative.   Physical Exam Updated Vital Signs BP 101/78 (BP Location: Right Arm)   Pulse 82   Temp 98.5 F (36.9 C) (Oral)   Resp 16   Ht 5' 8"  (1.727 m)   Wt 84.8 kg (187 lb)   SpO2 98%   BMI 28.43 kg/m   Physical Exam  Constitutional: He is oriented to person, place, and time. He appears well-developed and well-nourished.  HENT:  Head: Normocephalic and atraumatic.  Eyes: Pupils are equal, round, and reactive to light. Conjunctivae are  normal. Right eye exhibits no discharge. Left eye exhibits no discharge. No scleral icterus.  Neck: Normal range of motion. No JVD present. No tracheal deviation present.  Pulmonary/Chest: Effort normal. No stridor.  Musculoskeletal:  Laceration through tendon noted below (carpi radialis brevis) many tendons intact, full active range of motion of the fingers in extension full active strength at the wrist with extension and radial deviation-distal sensation intact-no joint involvement  Neurological: He is alert and oriented to person, place, and time. Coordination normal.  Psychiatric: He has a normal mood and affect. His behavior is normal. Judgment and thought content normal.  Nursing note and vitals reviewed.      ED Treatments / Results  Labs (all labs ordered are listed,  but only abnormal results are displayed) Labs Reviewed - No data to display  EKG None  Radiology Dg Wrist Complete Right  Result Date: 05/03/2018 CLINICAL DATA:  Laceration posterior hand and wrist proximal to the thumb EXAM: RIGHT WRIST - COMPLETE 3+ VIEW COMPARISON:  None. FINDINGS: No fracture or malalignment. No radiopaque foreign body in the soft tissues. IMPRESSION: No acute osseous abnormality. Electronically Signed   By: Donavan Foil M.D.   On: 05/03/2018 19:19    Procedures .Marland KitchenLaceration Repair Date/Time: 05/03/2018 9:14 PM Performed by: Okey Regal, PA-C Authorized by: Okey Regal, PA-C   Consent:    Consent obtained:  Verbal   Consent given by:  Patient   Risks discussed:  Infection, pain and need for additional repair   Alternatives discussed:  No treatment and referral Anesthesia (see MAR for exact dosages):    Anesthesia method:  Local infiltration   Local anesthetic:  Lidocaine 2% w/o epi Laceration details:    Location: right wrsit    Length (cm):  3 Repair type:    Repair type:  Simple Pre-procedure details:    Preparation:  Patient was prepped and draped in usual sterile fashion Exploration:    Hemostasis achieved with:  Direct pressure   Wound exploration: wound explored through full range of motion and entire depth of wound probed and visualized     Wound extent: tendon damage     Wound extent: no foreign bodies/material noted, no muscle damage noted, no underlying fracture noted and no vascular damage noted     Tendon damage location:  Upper extremity   Upper extremity tendon damage location: Carpi radialis brevis.   Tendon damage extent:  Partial transection   Tendon repair plan:  Refer for evaluation   Contaminated: no   Treatment:    Area cleansed with:  Saline   Amount of cleaning:  Standard   Irrigation solution:  Sterile saline   Irrigation method:  Syringe   Visualized foreign bodies/material removed: no   Skin repair:    Repair  method:  Sutures   Suture size:  5-0   Suture material:  Fast-absorbing gut   Number of sutures:  7 Approximation:    Approximation:  Close Post-procedure details:    Dressing:  Antibiotic ointment and non-adherent dressing   Patient tolerance of procedure:  Tolerated well, no immediate complications   (including critical care time)    Medications Ordered in ED Medications  Tdap (BOOSTRIX) injection 0.5 mL (has no administration in time range)  lidocaine (XYLOCAINE) 2 % (with pres) injection 400 mg (400 mg Infiltration Given 05/03/18 1910)  cephALEXin (KEFLEX) capsule 500 mg (500 mg Oral Given 05/03/18 2116)     Initial Impression / Assessment and Plan / ED Course  I have  reviewed the triage vital signs and the nursing notes.  Pertinent labs & imaging results that were available during my care of the patient were reviewed by me and considered in my medical decision making (see chart for details).     Labs:   Imaging: Dg wrist complete right   Consults: Orthopedic hand surgeon Dr. Grandville Silos  Therapeutics: lidocaine   Discharge Meds:   Assessment/Plan: 59 YOM presents with laceration involving right extensor carpi radialis brevis. This is a partial laceration, pt retains full ROM and strength.  Due to tendon laceration hand surgery was consulted, no need for immediate repair of this.  Patient has only partial involvement of the tendon.  He will be placed on oral antibiotics, he be placed in a splint, he will follow-up as an outpatient as needed, he will return to the emergency room if he develops any complications.  Patient given strict return precautions, he verbalized understanding and agreement to today's plan had no further questions or concerns the time discharge    Final Clinical Impressions(s) / ED Diagnoses   Final diagnoses:  Laceration of right wrist, initial encounter    ED Discharge Orders        Ordered    cephALEXin (KEFLEX) 500 MG capsule  2 times daily       05/03/18 2102       Okey Regal, PA-C 05/03/18 2118    Duffy Bruce, MD 05/04/18 1207

## 2018-05-03 NOTE — Discharge Instructions (Addendum)
Please read attached information. If you experience any new or worsening signs or symptoms please return to the emergency room for evaluation. Please follow-up with your primary care provider or specialist as discussed. Please use medication prescribed only as directed and discontinue taking if you have any concerning signs or symptoms.   °

## 2018-05-03 NOTE — ED Triage Notes (Signed)
Pt cut right wrist with stump grinder pta. Pt able to move all digits freely. Bleeding controlled.

## 2018-05-11 ENCOUNTER — Encounter (HOSPITAL_COMMUNITY): Payer: Self-pay | Admitting: Emergency Medicine

## 2018-05-11 ENCOUNTER — Emergency Department (HOSPITAL_COMMUNITY)
Admission: EM | Admit: 2018-05-11 | Discharge: 2018-05-11 | Disposition: A | Discharge status: Home / Self Care | Payer: BLUE CROSS/BLUE SHIELD | Attending: Emergency Medicine | Admitting: Emergency Medicine

## 2018-05-11 DIAGNOSIS — Z4802 Encounter for removal of sutures: Secondary | ICD-10-CM | POA: Diagnosis not present

## 2018-05-11 DIAGNOSIS — S61511D Laceration without foreign body of right wrist, subsequent encounter: Secondary | ICD-10-CM | POA: Insufficient documentation

## 2018-05-11 DIAGNOSIS — Z87891 Personal history of nicotine dependence: Secondary | ICD-10-CM | POA: Insufficient documentation

## 2018-05-11 DIAGNOSIS — X58XXXD Exposure to other specified factors, subsequent encounter: Secondary | ICD-10-CM | POA: Diagnosis not present

## 2018-05-11 NOTE — Discharge Instructions (Addendum)
Please read attached information. If you experience any new or worsening signs or symptoms please return to the emergency room for evaluation. Please follow-up with your primary care provider or specialist as discussed.  °

## 2018-05-11 NOTE — ED Provider Notes (Signed)
[facility] Provider Note   CSN: [id number] Arrival date & time: 05/11/18  1902     History   Chief Complaint Chief Complaint  Patient presents with  . Suture / Staple Removal    HPI [name] is a 57 y.o. male.  HPI   58 year old male presents today for suture removal.  Patient was seen 8 days ago after suffering a laceration to his wrist.  Patient had partial tendon involvement.  Patient had wound repaired, placed on antibiotics and was discharged with outpatient hand surgery follow-up.  Patient presents today for suture removal.  Patient notes intermittent swelling over the dorsal wrist, no significant swelling presently.  He notes this is worse when not wearing brace or wrap.  He denies any warmth to touch, discharge or redness.  He has full active range of motion of his wrist and fingers.  Past Medical History:  Diagnosis Date  . Adenomatous colon polyp   . Arthritis   . Iron deficiency anemia   . 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 12-14-14   . Ulcerative colitis     Patient Active Problem List   Diagnosis Date Noted  . Loose body in knee, right 11/12/2017  . Impingement syndrome of left shoulder 11/12/2017  . Chronic pain of right knee 11/02/2017  . Chronic pain of left knee 11/02/2017  . Mechanical pain of right knee 11/02/2017  . UC (ulcerative colitis) ([place]) 08/18/2012  . ULCERATIVE COLITIS 04/11/2010    Past Surgical History:  Procedure Laterality Date  . COLONOSCOPY    . fracture left ankle    . KNEE ARTHROSCOPY     right  . rotator cuff right shoulder          Home Medications    Prior to Admission medications   Medication Sig Start Date End Date Taking? Authorizing Provider  Adalimumab ([name]) 40 MG/0.4ML PSKT Inject 1 pen into the skin once a week. 03/31/18   Mauri Pole, MD  cephALEXin (KEFLEX) 500 MG capsule Take 1 capsule (500 mg total) by mouth 2 (two) times daily.  05/03/18   Paris Hohn, Dellis Filbert, PA-C  cyanocobalamin 500 MCG tablet Take 500 mcg by mouth as needed.    [provider]  mesalamine (CANASA) 1000 MG suppository Place 1 suppository (1,000 mg total) rectally at bedtime. 04/27/17   Mauri Pole, MD  Probiotic Product (PROBIOTIC-10) CAPS Take by mouth daily.    [provider]    Family History Family History  Problem Relation Age of Onset  . Colitis Mother   . Colon cancer Neg Hx     Social History Social History   Tobacco Use  . Smoking status: Former Smoker    Last attempt to quit: 04/22/1991    Years since quitting: 27.0  . Smokeless tobacco: Never Used  Substance Use Topics  . Alcohol use: No  . Drug use: No     Allergies   Patient has no known allergies.   Review of Systems Review of Systems  All other systems reviewed and are negative.    Physical Exam Updated Vital Signs Ht 5' 8"  (1.727 m)   Wt 84.8 kg (187 lb)   BMI 28.43 kg/m   Physical Exam  Constitutional: He is oriented to person, place, and time. He appears well-developed and well-nourished.  HENT:  Head: Normocephalic and atraumatic.  Eyes: Pupils are equal, round, and reactive to light. Conjunctivae are normal. Right eye exhibits no  discharge. Left eye exhibits no discharge. No scleral icterus.  Neck: Normal range of motion. No JVD present. No tracheal deviation present.  Pulmonary/Chest: Effort normal. No stridor.  Musculoskeletal:  Dorsal wrist with no significant swelling or edema, no warmth to touch, minor tenderness over the repaired area, full active range of motion of the wrist fingers, sensation intact  Neurological: He is alert and oriented to person, place, and time. Coordination normal.  Psychiatric: He has a normal mood and affect. His behavior is normal. Judgment and thought content normal.  Nursing note and vitals reviewed.   ED Treatments / Results  Labs (all labs ordered are listed, but only abnormal results are  displayed) Labs Reviewed - No data to display  EKG None  Radiology No results found.  Procedures .Suture Removal Date/Time: 05/11/2018 8:02 PM Performed by: Okey Regal, PA-C Authorized by: Okey Regal, PA-C   Consent:    Consent obtained:  Verbal   Consent given by:  Patient   Risks discussed:  Pain   Alternatives discussed:  No treatment and alternative treatment Location:    Location:  Upper extremity   Upper extremity location:  Wrist   Wrist location:  R wrist Procedure details:    Wound appearance:  No signs of infection, good wound healing and clean   Number of sutures removed:  4 Post-procedure details:    Patient tolerance of procedure:  Tolerated well, no immediate complications   (including critical care time)  Medications Ordered in ED Medications - No data to display   Initial Impression / Assessment and Plan / ED Course  I have reviewed the triage vital signs and the nursing notes.  Pertinent labs & imaging results that were available during my care of the patient were reviewed by me and considered in my medical decision making (see chart for details).       Final Clinical Impressions(s) / ED Diagnoses   Final diagnoses:  Visit for suture removal   Labs:   Imaging:  Consults:  Therapeutics:  Discharge Meds:   Assessment/Plan: 57 year old male presents today for suture removal.  Patient had 7 dissolvable sutures placed.  I placed these myself.  Patient was supposed to follow-up with hand surgery, he did not follow-up, but has no significant comp gating features at this time.  Patient does have reported intermittent swelling in the dorsal wrist, he will follow-up as an outpatient with hand surgery, he has no signs of infection.  I removed all but 3 of his stitches as he does have an area that has not completely approximated given the high tension area.  She is given strict return precautions, follow-up information.  He verbalized  understanding and agreement to today's plan had no further questions or concerns.   ED Discharge Orders    None       Francee Gentile 05/11/18 2002    Valarie Merino, MD 05/11/18 2250

## 2018-05-11 NOTE — ED Notes (Signed)
Sutures removed by PA in triage.

## 2018-05-11 NOTE — ED Notes (Signed)
E-signature not available, pt verbalized understanding of DC instructions  

## 2018-05-11 NOTE — ED Triage Notes (Signed)
Pt here to have stitches removed from R wrist.

## 2018-06-01 ENCOUNTER — Telehealth: Payer: Self-pay | Admitting: Gastroenterology

## 2018-06-02 NOTE — Telephone Encounter (Signed)
Trying to determine the timing of [name] drug level testing. No kits available right now. Will have to call him when there is a kit for testing.

## 2018-06-10 NOTE — Telephone Encounter (Signed)
Patient notified to come for his test kit. Labs to be drawn day before the next [name] injection.

## 2018-06-29 ENCOUNTER — Telehealth: Payer: Self-pay | Admitting: Gastroenterology

## 2018-06-29 NOTE — Telephone Encounter (Signed)
Adalimumab undetectable drug trough with elevated Antibody level >160  Advised him to discontinue [name].   Will start oral mesalamine. Patient wants to read the drug information and would like to try samples first.  He will need 2 tablets of Apriso 715m daily or equivalent.  Follow up in office visit in 3 months or sooner if needed  K. VDenzil Magnuson, MD [phone]

## 2018-07-01 NOTE — Telephone Encounter (Signed)
No samples available

## 2018-07-05 ENCOUNTER — Other Ambulatory Visit: Payer: Self-pay

## 2018-07-05 MED ORDER — MESALAMINE ER 0.375 G PO CP24: 750.0000 mg | ORAL_CAPSULE | Freq: Every day | ORAL | 3 refills | Status: DC

## 2018-07-12 NOTE — Progress Notes (Deleted)
[name]    [id number]    September 21, 1961  Primary Care Physician:Doolittle, Linton Ham, MD (Inactive)  Referring Physician: No referring provider defined for this encounter.  Chief complaint: Ulcerative colitis  HPI: 57 year old male with history of pancolonic ulcerative colitis diagnosed in 1995 currently in clinical remission. Colonoscopy with random biopsies October 2018 showed left-sided minimal active colitis Adalimumab drug trough level undetectable with elevated antibody greater than 160  Previous HPI 57 year old male with history of pancolonic ulcerative colitis initially diagnosed in 1995 currently in remission on [name] is here for routine annual visit.  He is doing well overall on [name] and has not had any recent flares.  Denies any diarrhea, increased bowel frequency, abdominal pain, skin rash, joint pain or blood per rectum.  He uses occasional Canasa suppositories when he notices increased mucus discharge.  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 Reviewed recent labs from February 2019, CBC, CMP, iron panel, ferritin, folate, B12 and CRP within normal limits. TB QuantiFERON gold negative Colonoscopy September 18, 2017 showed altered vascular pattern, inactive colitis, 9 mm polyp removed from rectum with hot snare. Based on pathology, biopsies from the left colon showed minimally active colitis.  Right colon biopsies showed normal mucosa.  The polyp was hyperplastic.  Recall colonoscopy in 2 years for surveillance  Outpatient Encounter Medications as of 07/13/2018  Medication Sig  . Adalimumab ([name]) 40 MG/0.4ML PSKT Inject 1 pen into the skin once a week.  . cephALEXin (KEFLEX) 500 MG capsule Take 1 capsule (500 mg total) by mouth 2 (two) times daily.  . cyanocobalamin 500 MCG tablet Take 500 mcg by mouth as needed.  . mesalamine (APRISO) 0.375 g 24 hr capsule Take 2 capsules (0.75 g total) by mouth daily.  . mesalamine (CANASA) 1000 MG  suppository Place 1 suppository (1,000 mg total) rectally at bedtime.  . Probiotic Product (PROBIOTIC-10) CAPS Take by mouth daily.   No facility-administered encounter medications on file as of 07/13/2018.     Allergies as of 07/13/2018  . (No Known Allergies)    Past Medical History:  Diagnosis Date  . Adenomatous colon polyp   . Arthritis   . Iron deficiency anemia   . 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 12-14-14   . Ulcerative colitis     Past Surgical History:  Procedure Laterality Date  . COLONOSCOPY    . fracture left ankle    . KNEE ARTHROSCOPY     right  . rotator cuff right shoulder      Family History  Problem Relation Age of Onset  . Colitis Mother   . Colon cancer Neg Hx     Social History   Socioeconomic History  . Marital status: Married    Spouse name: Not on file  . Number of children: 4  . Years of education: Not on file  . Highest education level: Not on file  Occupational History  . Occupation: tree service  Social Needs  . Financial resource strain: Not on file  . Food insecurity:    Worry: Not on file    Inability: Not on file  . Transportation needs:    Medical: Not on file    Non-medical: Not on file  Tobacco Use  . Smoking status: Former Smoker    Last attempt to quit: 04/22/1991    Years since quitting: 27.2  . Smokeless tobacco: Never Used  Substance  and Sexual Activity  . Alcohol use: No  . Drug use: No  . Sexual activity: Not on file  Lifestyle  . Physical activity:    Days per week: Not on file    Minutes per session: Not on file  . Stress: Not on file  Relationships  . Social connections:    Talks on phone: Not on file    Gets together: Not on file    Attends religious service: Not on file    Active member of club or organization: Not on file    Attends meetings of clubs or organizations: Not on file    Relationship status: Not on file  . Intimate partner violence:    Fear of current or ex partner:  Not on file    Emotionally abused: Not on file    Physically abused: Not on file    Forced sexual activity: Not on file  Other Topics Concern  . Not on file  Social History Narrative  . Not on file      Review of systems: Review of Systems  Constitutional: Negative for fever and chills.  HENT: Negative.   Eyes: Negative for blurred vision.  Respiratory: Negative for cough, shortness of breath and wheezing.   Cardiovascular: Negative for chest pain and palpitations.  Gastrointestinal: as per HPI Genitourinary: Negative for dysuria, urgency, frequency and hematuria.  Musculoskeletal: Negative for myalgias, back pain and joint pain.  Skin: Negative for itching and rash.  Neurological: Negative for dizziness, tremors, focal weakness, seizures and loss of consciousness.  Endo/Heme/Allergies: Positive for seasonal allergies.  Psychiatric/Behavioral: Negative for depression, suicidal ideas and hallucinations.  All other systems reviewed and are negative.   Physical Exam: There were no vitals filed for this visit. There is no height or weight on file to calculate BMI. Gen:      No acute distress HEENT:  EOMI, sclera anicteric Neck:     No masses; no thyromegaly Lungs:    Clear to auscultation bilaterally; normal respiratory effort CV:         Regular rate and rhythm; no murmurs Abd:      + bowel sounds; soft, non-tender; no palpable masses, no distension Ext:    No edema; adequate peripheral perfusion Skin:      Warm and dry; no rash Neuro: alert and oriented x 3 Psych: normal mood and affect  Data Reviewed:  Reviewed labs, radiology imaging, old records and pertinent past GI work up   Assessment and Plan/Recommendations:  ***  25 minutes was spent face-to-face with the patient. Greater than 50% of the time used for counseling as well as treatment plan and follow-up. She had multiple questions which were answered to her satisfaction  K. Denzil Magnuson , MD [phone]     CC: No ref. provider found

## 2018-07-13 ENCOUNTER — Ambulatory Visit: Payer: BLUE CROSS/BLUE SHIELD | Admitting: Gastroenterology

## 2018-07-14 NOTE — Telephone Encounter (Signed)
Patient given samples, written instructions, a printed prescription and a co-pay discount coupon. He is to read the information about the Apriso. Start the samples if he is comfortable with the information he reads about the medication. And also investigate the out of pocket cost for the prescription.

## 2018-08-11 ENCOUNTER — Encounter: Payer: Self-pay | Admitting: Gastroenterology

## 2018-09-13 ENCOUNTER — Ambulatory Visit (INDEPENDENT_AMBULATORY_CARE_PROVIDER_SITE_OTHER): Payer: BLUE CROSS/BLUE SHIELD | Admitting: Orthopedic Surgery

## 2018-09-13 ENCOUNTER — Encounter (INDEPENDENT_AMBULATORY_CARE_PROVIDER_SITE_OTHER): Payer: Self-pay | Admitting: Orthopedic Surgery

## 2018-09-13 ENCOUNTER — Ambulatory Visit (INDEPENDENT_AMBULATORY_CARE_PROVIDER_SITE_OTHER): Payer: BLUE CROSS/BLUE SHIELD

## 2018-09-13 DIAGNOSIS — M25512 Pain in left shoulder: Secondary | ICD-10-CM

## 2018-09-13 DIAGNOSIS — G8929 Other chronic pain: Secondary | ICD-10-CM | POA: Diagnosis not present

## 2018-09-15 ENCOUNTER — Telehealth: Payer: Self-pay | Admitting: Gastroenterology

## 2018-09-15 ENCOUNTER — Other Ambulatory Visit: Payer: Self-pay

## 2018-09-15 DIAGNOSIS — K5190 Ulcerative colitis, unspecified, without complications: Secondary | ICD-10-CM

## 2018-09-15 DIAGNOSIS — M25512 Pain in left shoulder: Secondary | ICD-10-CM

## 2018-09-15 DIAGNOSIS — G8929 Other chronic pain: Secondary | ICD-10-CM | POA: Diagnosis not present

## 2018-09-15 MED ORDER — LIDOCAINE HCL 1 % IJ SOLN
5.0000 mL | INTRAMUSCULAR | Status: AC | PRN
  Administered 2018-09-15: 5 mL

## 2018-09-15 MED ORDER — BUPIVACAINE HCL 0.5 % IJ SOLN
9.0000 mL | INTRAMUSCULAR | Status: AC | PRN
  Administered 2018-09-15: 9 mL via INTRA_ARTICULAR

## 2018-09-15 MED ORDER — METHYLPREDNISOLONE ACETATE 40 MG/ML IJ SUSP
40.0000 mg | INTRAMUSCULAR | Status: AC | PRN
  Administered 2018-09-15: 40 mg via INTRA_ARTICULAR

## 2018-09-15 MED ORDER — MESALAMINE 1000 MG RE SUPP: 1000.0000 mg | Freq: Every day | RECTAL | 3 refills | Status: DC

## 2018-09-15 NOTE — Telephone Encounter (Signed)
Patient needs Canasa suppositories. New Rx sent to the pharmacy [phone].

## 2018-09-15 NOTE — Telephone Encounter (Signed)
Pharmacy will order the Orange. They predict it will be ready for pick up tomorrow after 5 pm. The co-pay should be $0. Patient is notified.

## 2018-09-15 NOTE — Progress Notes (Signed)
Office Visit Note   Patient: [name]           Date of Birth: 10/21/1961           MRN: [id number] Visit Date: 09/13/2018 Requested by: No referring provider defined for this encounter. PCP: Leandrew Koyanagi, MD (Inactive)  Subjective: Chief Complaint  Patient presents with  . Left Shoulder - Pain    HPI: [name] is a patient with left shoulder pain.  He actually has had pain in the left shoulder since falling through the floor in a metal building in November 2018.  Dislocated his shoulder at that time and put it back in himself.  He has had arthroscopy on the shoulder which did not show any full-thickness rotator cuff pathology or labral pathology.  Reports continued pain.  Reports pain in the anterior region like an ice pick.  He has had right shoulder instability with stabilizing surgery which has kept the shoulder in good position and alignment.              ROS: All systems reviewed are negative as they relate to the chief complaint within the history of present illness.  Patient denies  fevers or chills.   Assessment & Plan: Visit Diagnoses:  1. Chronic left shoulder pain   2. Left shoulder pain, unspecified chronicity     Plan: Impression is left shoulder pain in a patient who is in his 48s he dislocated his shoulder last year.  Had arthroscopic debridement and decompression without much relief.  Plan is subacromial injection today with MRI scan left shoulder which is an arthrogram to evaluate possible labral pathology.  I will see him back after that study.  Follow-Up Instructions: Return for after MRI.   Orders:  Orders Placed This Encounter  Procedures  . XR Shoulder Left  . MR Shoulder Left w/ contrast  . Arthrogram   No orders of the defined types were placed in this encounter.     Procedures: Large Joint Inj: L subacromial bursa on 09/15/2018 12:23 PM Indications: diagnostic evaluation and pain Details: 18 G 1.5 in needle, posterior  approach  Arthrogram: No  Medications: 9 mL bupivacaine 0.5 %; 40 mg methylPREDNISolone acetate 40 MG/ML; 5 mL lidocaine 1 % Outcome: tolerated well, no immediate complications Procedure, treatment alternatives, risks and benefits explained, specific risks discussed. Consent was given by the patient. Immediately prior to procedure a time out was called to verify the correct patient, procedure, equipment, support staff and site/side marked as required. Patient was prepped and draped in the usual sterile fashion.       Clinical Data: No additional findings.  Objective: Vital Signs: There were no vitals taken for this visit.  Physical Exam:   Constitutional: Patient appears well-developed HEENT:  Head: Normocephalic Eyes:EOM are normal Neck: Normal range of motion Cardiovascular: Normal rate Pulmonary/chest: Effort normal Neurologic: Patient is alert Skin: Skin is warm Psychiatric: Patient has normal mood and affect    Ortho Exam: Ortho demonstrates good shoulder range of motion with equivocal apprehension on the left.  Rotator cuff strength is pretty reasonable to supraspinatus supraspinatus and subscap muscle testing.  Does have some tenderness over the biceps tendon anteriorly but no discrete AC joint tenderness is present.  No restriction of external rotation of 15 degrees of abduction  Specialty Comments:  No specialty comments available.  Imaging: No results found.   PMFS History: Patient Active Problem List   Diagnosis Date Noted  . Loose body in knee, right  11/12/2017  . Impingement syndrome of left shoulder 11/12/2017  . Chronic pain of right knee 11/02/2017  . Chronic pain of left knee 11/02/2017  . Mechanical pain of right knee 11/02/2017  . UC (ulcerative colitis) ([place]) 08/18/2012  . ULCERATIVE COLITIS 04/11/2010   Past Medical History:  Diagnosis Date  . Adenomatous colon polyp   . Arthritis   . Iron deficiency anemia   . 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 12-14-14   . Ulcerative colitis     Family History  Problem Relation Age of Onset  . Colitis Mother   . Colon cancer Neg Hx     Past Surgical History:  Procedure Laterality Date  . COLONOSCOPY    . fracture left ankle    . KNEE ARTHROSCOPY     right  . rotator cuff right shoulder     Social History   Occupational History  . Occupation: tree service  Tobacco Use  . Smoking status: Former Smoker    Last attempt to quit: 04/22/1991    Years since quitting: 27.4  . Smokeless tobacco: Never Used  Substance and Sexual Activity  . Alcohol use: No  . Drug use: No  . Sexual activity: Not on file

## 2018-09-29 IMAGING — MR MR SHOULDER*L* W/O CM
4 of 6 series · 19 of 40 positions shown · non-contrast
Comparison: MRI dated 02/02/2012

CLINICAL DATA: Left shoulder pain and decreased range of motion.

EXAM:
MRI OF THE LEFT SHOULDER WITHOUT CONTRAST
TECHNIQUE: Multiplanar, multisequence MR imaging of the shoulder was performed.
No intravenous contrast was administered.

[Series 2: T2 fat-sat · axial · 4.0mm · 0.23mm/px · z∈[-41,+49]mm · 4 of 22 slices shown (1 of 2)]
[im 1/22]
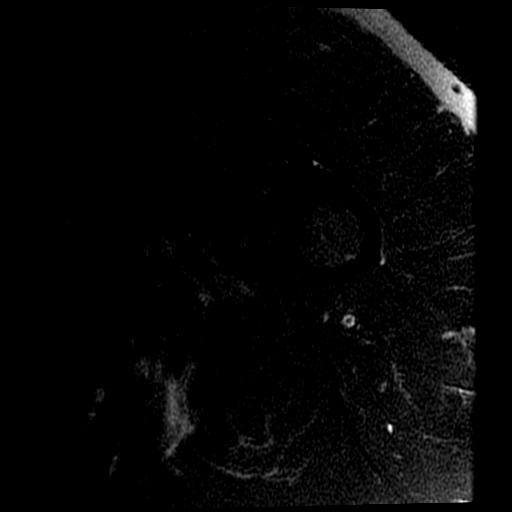
[im 4/22]
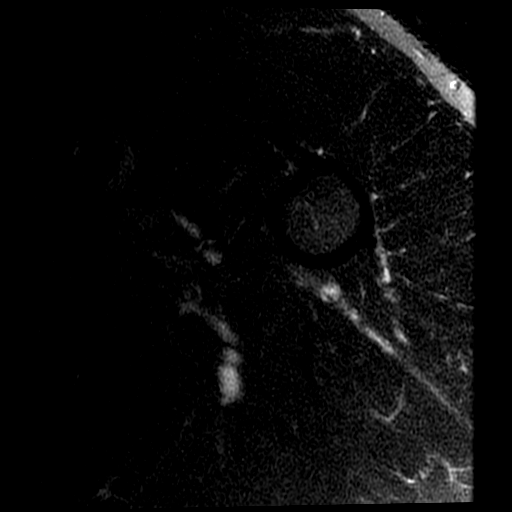
[im 13/22]
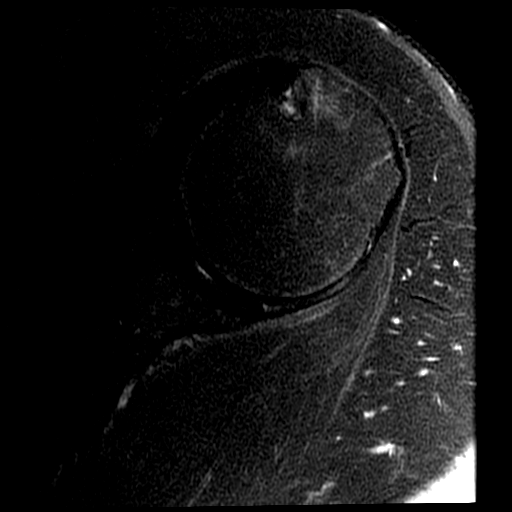
[im 19/22]
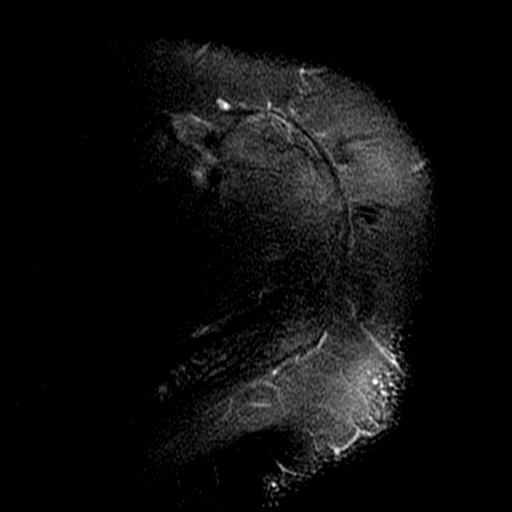

[Series 3: T2 fat-sat · oblique · 4.0mm · 0.31mm/px · 3 of 19 slices shown (2 of 2)]
[im 4/19]
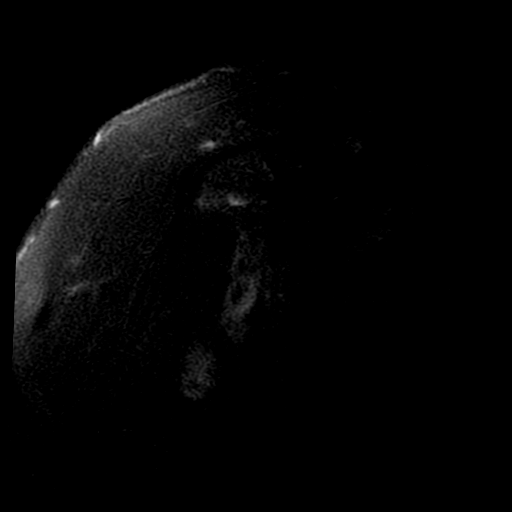
[im 11/19]
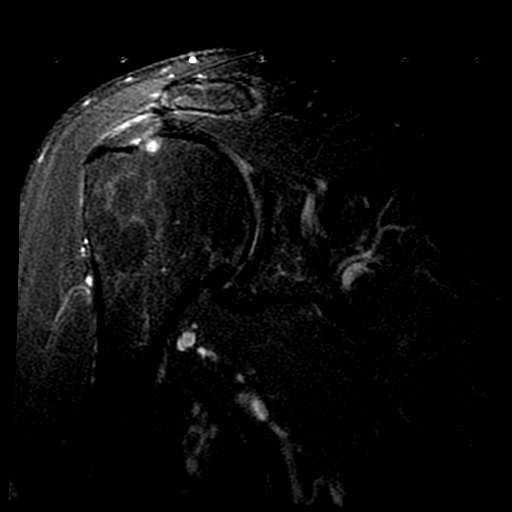
[im 19/19]
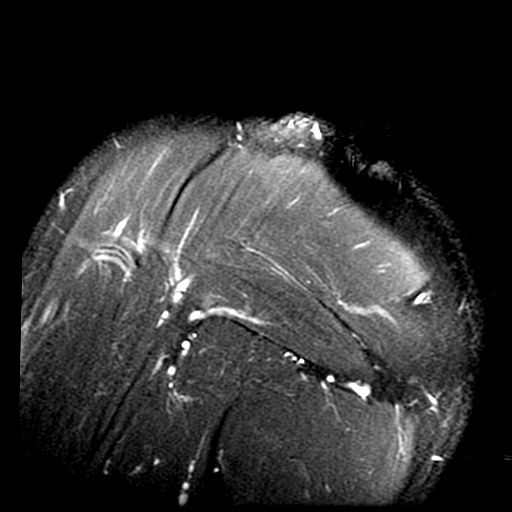

[Series 4: PD · oblique · 4.0mm · 0.31mm/px · 6 of 19 slices shown (1 of 2)]
[im 1/19]
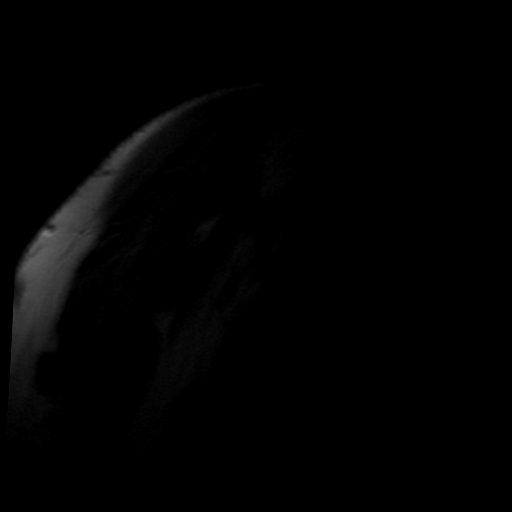
[im 4/19]
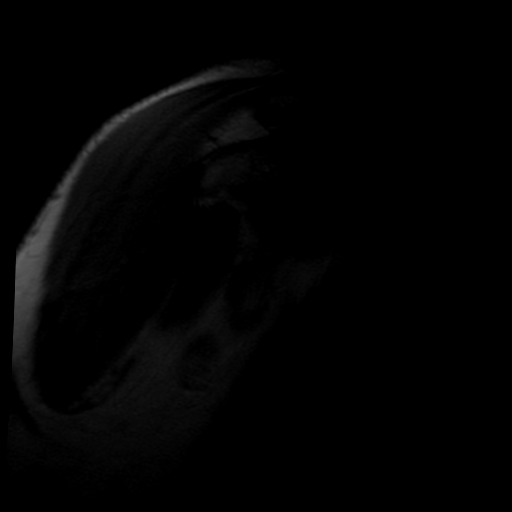
[im 8/19]
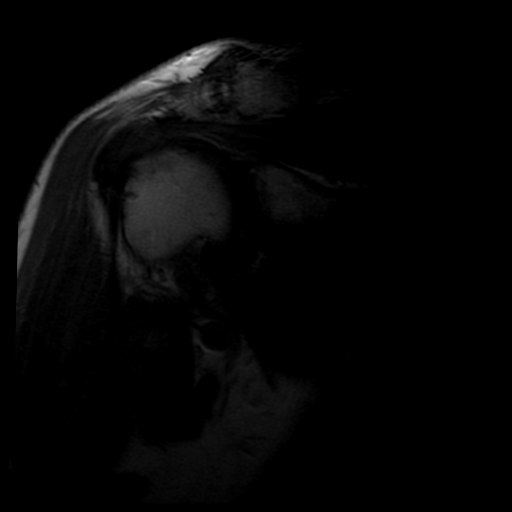
[im 11/19]
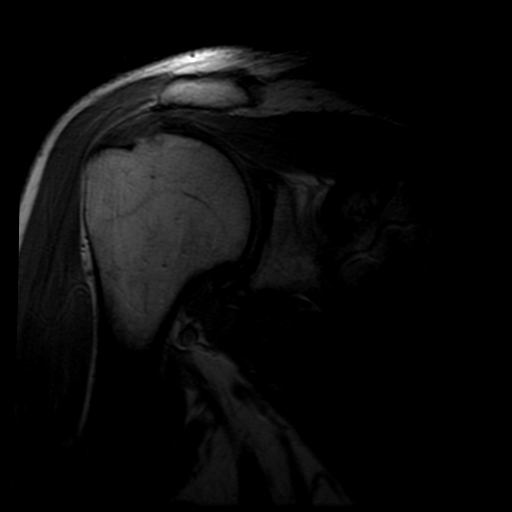
[im 15/19]
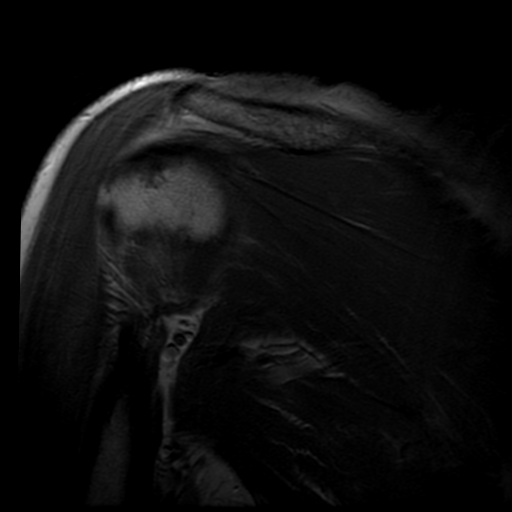
[im 19/19]
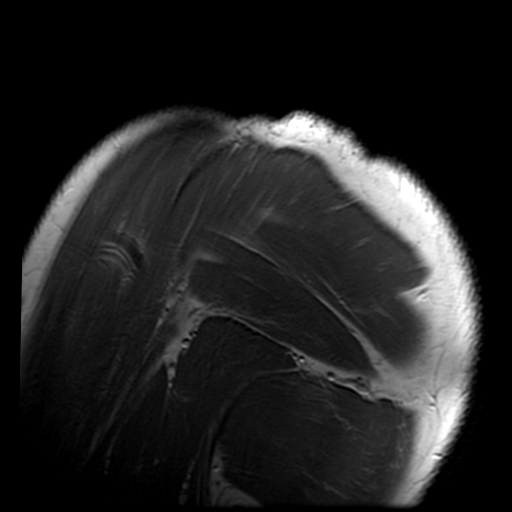

[Series 7: PD · oblique · 4.0mm · 0.31mm/px · 6 of 19 slices shown (2 of 2)]
[im 1/19]
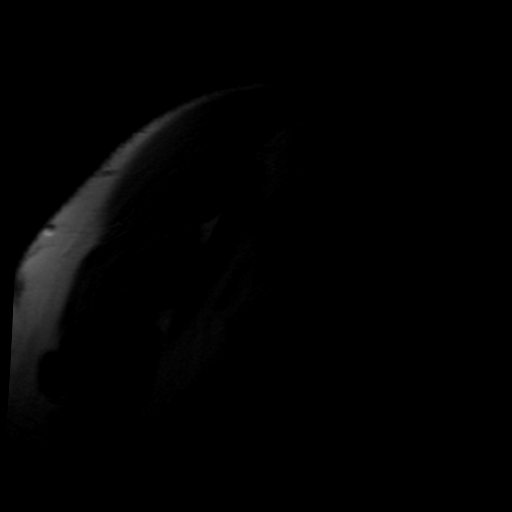
[im 4/19]
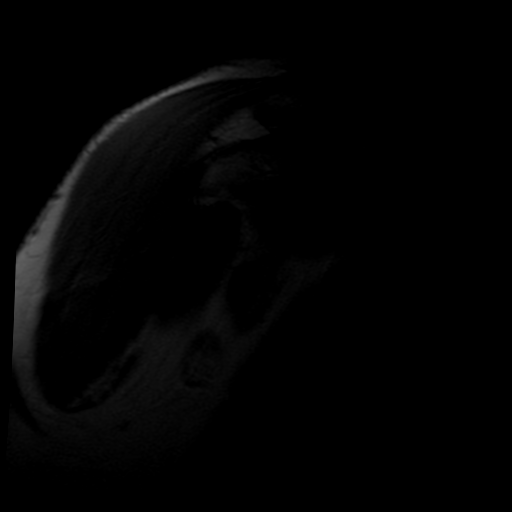
[im 8/19]
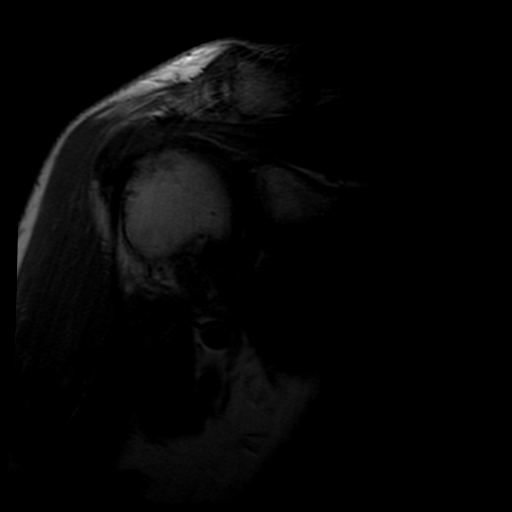
[im 11/19]
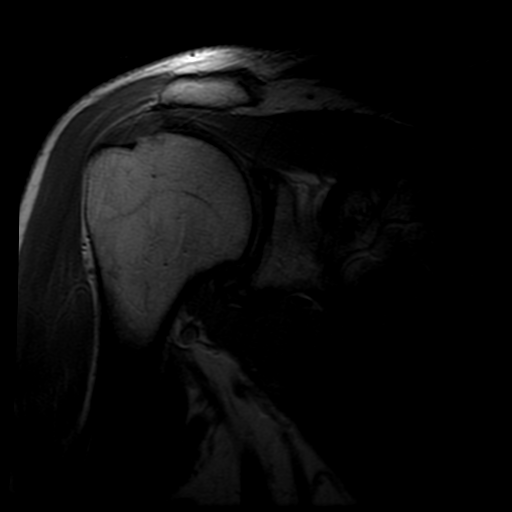
[im 15/19]
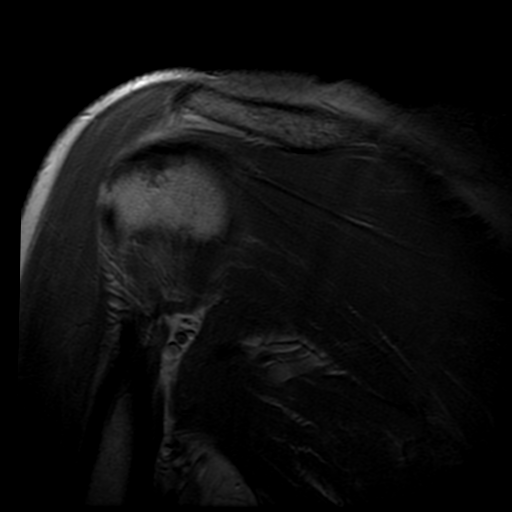
[im 19/19]
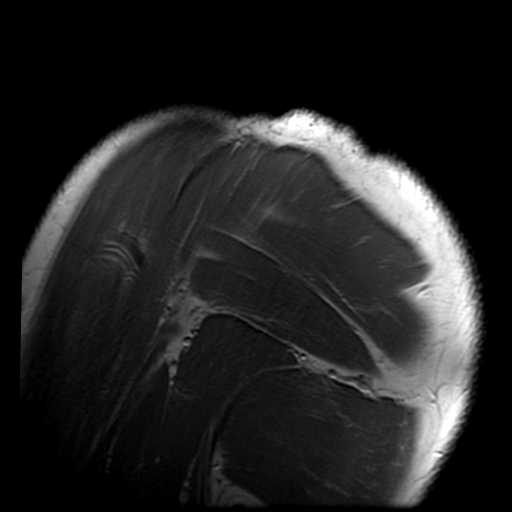

[19 of 40 positions shown; findings below may reference images not displayed]

FINDINGS: Rotator cuff: There is a tiny superficial rim rent tear of the
articular surface of the distal infraspinatus tendon seen on image 9
of series 3 and image 16 of series 5. Slight chronic tendinopathy of
the distal supraspinatus tendon. subscapularis and teres minor
tendons are normal.

Muscles: No atrophy or abnormal signal of the muscles of the rotator
cuff.

Biceps long head:  Properly located and intact.

Acromioclavicular Joint: Slight progression of moderate AC joint
arthropathy. Type 2 acromion. No bursitis.

Glenohumeral Joint: Normal.

Labrum:  Intact.

Bones: Progressive degenerative changes of the anterior aspect of
the greater tuberosity. Subcortical cyst formation in the humeral
head adjacent to the distal infraspinatus insertion.

Other: None
IMPRESSION: 1. Focal superficial rim rent tear of the articular surface of the
distal supraspinous tendon with adjacent cystic degenerative changes
in the humeral head.
2. Slight progression of moderate AC joint arthropathy.

## 2018-10-04 ENCOUNTER — Inpatient Hospital Stay: Admission: RE | Admit: 2018-10-04 | Payer: BLUE CROSS/BLUE SHIELD | Source: Ambulatory Visit

## 2018-10-04 ENCOUNTER — Inpatient Hospital Stay
Admission: RE | Admit: 2018-10-04 | Discharge: 2018-10-04 | Disposition: A | Discharge status: Home / Self Care | Payer: BLUE CROSS/BLUE SHIELD | Source: Ambulatory Visit | Attending: Orthopedic Surgery | Admitting: Orthopedic Surgery

## 2018-10-11 ENCOUNTER — Ambulatory Visit (INDEPENDENT_AMBULATORY_CARE_PROVIDER_SITE_OTHER): Payer: BLUE CROSS/BLUE SHIELD | Admitting: Orthopedic Surgery

## 2018-10-12 ENCOUNTER — Ambulatory Visit
Admission: RE | Admit: 2018-10-12 | Discharge: 2018-10-12 | Disposition: A | Discharge status: Home / Self Care | Payer: BLUE CROSS/BLUE SHIELD | Source: Ambulatory Visit | Attending: Orthopedic Surgery | Admitting: Orthopedic Surgery

## 2018-10-12 DIAGNOSIS — M25512 Pain in left shoulder: Principal | ICD-10-CM

## 2018-10-12 DIAGNOSIS — G8929 Other chronic pain: Secondary | ICD-10-CM

## 2018-10-12 MED ORDER — IOPAMIDOL (ISOVUE-M 200) INJECTION 41%
13.0000 mL | Freq: Once | INTRAMUSCULAR | Status: AC
  Administered 2018-10-12: 13 mL via INTRA_ARTICULAR

## 2018-10-13 ENCOUNTER — Other Ambulatory Visit (INDEPENDENT_AMBULATORY_CARE_PROVIDER_SITE_OTHER): Payer: BLUE CROSS/BLUE SHIELD

## 2018-10-13 ENCOUNTER — Encounter: Payer: Self-pay | Admitting: Gastroenterology

## 2018-10-13 ENCOUNTER — Ambulatory Visit (INDEPENDENT_AMBULATORY_CARE_PROVIDER_SITE_OTHER): Payer: BLUE CROSS/BLUE SHIELD | Admitting: Gastroenterology

## 2018-10-13 VITALS — BP 104/80 | HR 76 | Ht 67.32 in | Wt 191.5 lb

## 2018-10-13 DIAGNOSIS — K5120 Ulcerative (chronic) proctitis without complications: Secondary | ICD-10-CM | POA: Diagnosis not present

## 2018-10-13 DIAGNOSIS — K5100 Ulcerative (chronic) pancolitis without complications: Secondary | ICD-10-CM | POA: Diagnosis not present

## 2018-10-13 LAB — COMPREHENSIVE METABOLIC PANEL
ALBUMIN: 4.2 g/dL (ref 3.5–5.2)
ALK PHOS: 93 U/L (ref 39–117)
ALT: 15 U/L (ref 0–53)
AST: 13 U/L (ref 0–37)
BUN: 19 mg/dL (ref 6–23)
CALCIUM: 9.4 mg/dL (ref 8.4–10.5)
CO2: 26 mEq/L (ref 19–32)
Chloride: 103 mEq/L (ref 96–112)
Creatinine, Ser: 0.91 mg/dL (ref 0.40–1.50)
GFR: 91.81 mL/min (ref >60.00)
Glucose, Bld: 121 mg/dL — ABNORMAL HIGH (ref 70–99)
POTASSIUM: 3.9 meq/L (ref 3.5–5.1)
Sodium: 137 mEq/L (ref 135–145)
TOTAL PROTEIN: 7.4 g/dL (ref 6.0–8.3)
Total Bilirubin: 0.4 mg/dL (ref 0.2–1.2)

## 2018-10-13 LAB — CBC WITH DIFFERENTIAL/PLATELET
Basophils Absolute: 0.1 10*3/uL (ref 0.0–0.1)
Basophils Relative: 0.8 % (ref 0.0–3.0)
EOS PCT: 5.2 % — AB (ref 0.0–5.0)
Eosinophils Absolute: 0.4 10*3/uL (ref 0.0–0.7)
HCT: 45.8 % (ref 39.0–52.0)
HEMOGLOBIN: 15.6 g/dL (ref 13.0–17.0)
LYMPHS PCT: 18.2 % (ref 12.0–46.0)
Lymphs Abs: 1.5 10*3/uL (ref 0.7–4.0)
MCHC: 34.1 g/dL (ref 30.0–36.0)
MCV: 88.7 fl (ref 78.0–100.0)
MONO ABS: 0.6 10*3/uL (ref 0.1–1.0)
MONOS PCT: 7.4 % (ref 3.0–12.0)
Neutro Abs: 5.5 10*3/uL (ref 1.4–7.7)
Neutrophils Relative %: 68.4 % (ref 43.0–77.0)
Platelets: 246 10*3/uL (ref 150.0–400.0)
RBC: 5.17 Mil/uL (ref 4.22–5.81)
RDW: 14.2 % (ref 11.5–15.5)
WBC: 8.1 10*3/uL (ref 4.0–10.5)

## 2018-10-13 LAB — VITAMIN B12: VITAMIN B 12: 699 pg/mL (ref 211–911)

## 2018-10-13 LAB — FERRITIN: Ferritin: 65.4 ng/mL (ref 22.0–322.0)

## 2018-10-13 LAB — VITAMIN D 25 HYDROXY (VIT D DEFICIENCY, FRACTURES): VITD: 42.19 ng/mL (ref 30.00–100.00)

## 2018-10-13 LAB — C-REACTIVE PROTEIN: CRP: 0.5 mg/dL (ref 0.5–20.0)

## 2018-10-13 LAB — SEDIMENTATION RATE: Sed Rate: 21 mm/hr — ABNORMAL HIGH (ref 0–20)

## 2018-10-13 NOTE — Patient Instructions (Signed)
Your provider has requested that you go to the basement level for lab work before leaving today. Press "B" on the elevator. The lab is located at the first door on the left as you exit the elevator.  Follow up with Dr. Silverio Decamp in one year.

## 2018-10-13 NOTE — Progress Notes (Signed)
[name]    [id number]    1961-04-02  Primary Care Physician:Doolittle, Linton Ham, MD (Inactive)  Referring Physician: No referring provider defined for this encounter.  Chief complaint: Ulcerative colitis  HPI: 57 year old male with history of pancolonic ulcerative colitis diagnosed in 1995 here for follow-up visit.  Last seen in office March 31, 2018 He was chronically maintained on [name], he has undetectable [name] level with elevated antibody >160 based on labs on April 12, 2018.  He has been off [name] for last 6 months.  Is currently taking Apriso 1 capsule twice daily.  Denies any nausea, vomiting, dysphagia, abdominal pain, melena, blood in stool, constipation, diarrhea, loss of appetite or weight loss.  He is doing well overall.  He is taking folate, B12, vitamin D and iron supplements in addition to probiotics.                                                                                                                                                                                                                                                                                                          Reviewed recent labs from February 2019, CBC, CMP, iron panel, ferritin, folate, B12 and CRP within normal limits. TB QuantiFERON gold negative  Relevant GI Hx:  Colonoscopy September 18, 2017 showed altered vascular pattern, inactive colitis, 9 mm polyp removed from rectum with hot snare. Based on pathology, biopsies from the left colon showed minimally active colitis.  Right colon biopsies showed normal mucosa.  The polyp was hyperplastic.  Recall colonoscopy in 2 years for surveillance  Outpatient Encounter Medications as of 10/13/2018  Medication Sig  . cyanocobalamin 500 MCG tablet Take 500 mcg by mouth as needed.  . mesalamine (APRISO) 0.375 g 24 hr capsule Take 2 capsules (0.75 g total) by mouth daily.  . mesalamine (CANASA) 1000 MG suppository Place 1  suppository (1,000 mg total) rectally at bedtime.  . Probiotic Product (PROBIOTIC-10) CAPS Take by mouth daily.  . [DISCONTINUED] Adalimumab ([name]) 40 MG/0.4ML PSKT  Inject 1 pen into the skin once a week.  . [DISCONTINUED] cephALEXin (KEFLEX) 500 MG capsule Take 1 capsule (500 mg total) by mouth 2 (two) times daily.   No facility-administered encounter medications on file as of 10/13/2018.     Allergies as of 10/13/2018  . (No Known Allergies)    Past Medical History:  Diagnosis Date  . Adenomatous colon polyp   . Arthritis   . Iron deficiency anemia   . 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 12-14-14   . Ulcerative colitis     Past Surgical History:  Procedure Laterality Date  . COLONOSCOPY    . fracture left ankle    . KNEE ARTHROSCOPY     right  . rotator cuff right shoulder      Family History  Problem Relation Age of Onset  . Colitis Mother   . Colon cancer Neg Hx     Social History   Socioeconomic History  . Marital status: Married    Spouse name: Not on file  . Number of children: 4  . Years of education: Not on file  . Highest education level: Not on file  Occupational History  . Occupation: tree service  Social Needs  . Financial resource strain: Not on file  . Food insecurity:    Worry: Not on file    Inability: Not on file  . Transportation needs:    Medical: Not on file    Non-medical: Not on file  Tobacco Use  . Smoking status: Former Smoker    Last attempt to quit: 04/22/1991    Years since quitting: 27.4  . Smokeless tobacco: Never Used  Substance and Sexual Activity  . Alcohol use: No  . Drug use: No  . Sexual activity: Not on file  Lifestyle  . Physical activity:    Days per week: Not on file    Minutes per session: Not on file  . Stress: Not on file  Relationships  . Social connections:    Talks on phone: Not on file    Gets together: Not on file    Attends religious service: Not on file    Active member of club  or organization: Not on file    Attends meetings of clubs or organizations: Not on file    Relationship status: Not on file  . Intimate partner violence:    Fear of current or ex partner: Not on file    Emotionally abused: Not on file    Physically abused: Not on file    Forced sexual activity: Not on file  Other Topics Concern  . Not on file  Social History Narrative  . Not on file      Review of systems: Review of Systems  Constitutional: Negative for fever and chills.  HENT: Negative.   Eyes: Negative for blurred vision.  Respiratory: Negative for cough, shortness of breath and wheezing.   Cardiovascular: Negative for chest pain and palpitations.  Gastrointestinal: as per HPI Genitourinary: Negative for dysuria, urgency, frequency and hematuria.  Musculoskeletal: Negative for myalgias, back pain and joint pain.  Skin: Negative for itching and rash.  Neurological: Negative for dizziness, tremors, focal weakness, seizures and loss of consciousness.  Endo/Heme/Allergies: Negative for seasonal allergies.  Psychiatric/Behavioral: Negative for depression, suicidal ideas and hallucinations.  All other systems reviewed and are negative.   Physical Exam: Vitals:   10/13/18 0956  BP: 104/80  Pulse: 76   Body mass index is 29.71  kg/m. Gen:      No acute distress HEENT:  EOMI, sclera anicteric Neck:     No masses; no thyromegaly Lungs:    Clear to auscultation bilaterally; normal respiratory effort CV:         Regular rate and rhythm; no murmurs Abd:      + bowel sounds; soft, non-tender; no palpable masses, no distension Ext:    No edema; adequate peripheral perfusion Skin:      Warm and dry; no rash Neuro: alert and oriented x 3 Psych: normal mood and affect  Data Reviewed:  Reviewed labs, radiology imaging, old records and pertinent past GI work up   Assessment and Plan/Recommendations:  57 year old male with history of pancolonic ulcerative colitis diagnosed in  1995 maintained on [name], has been in clinical remission for many years now. [name] discontinued he had undetectable drug level with elevated antibody greater than 160 He continues to be asymptomatic He had minimal active colitis in left colon based on biopsies from colonoscopy September 18, 2017 Continue Apriso 2 tablets daily.  Check CBC, CMP, ESR, CRP, ferritin, folate, B12 and vitamin D level Discussed flu shot but patient is reluctant to get it.  Return in 1 year or sooner if needed   K. Denzil Magnuson , MD [phone]    CC: No ref. provider found

## 2018-10-25 ENCOUNTER — Other Ambulatory Visit: Payer: Self-pay

## 2018-10-25 MED ORDER — MESALAMINE ER 0.375 G PO CP24: 1500.0000 mg | ORAL_CAPSULE | Freq: Every day | ORAL | 6 refills | Status: DC

## 2018-11-15 ENCOUNTER — Telehealth (INDEPENDENT_AMBULATORY_CARE_PROVIDER_SITE_OTHER): Payer: Self-pay | Admitting: Orthopedic Surgery

## 2018-11-15 NOTE — Telephone Encounter (Signed)
Patient asked if he can get a call back concerning his MRI. Patient said he need the results. Patient said he had his MRI a month ago.   The number to contact patient is [phone]

## 2018-11-15 NOTE — Telephone Encounter (Signed)
Needs appt with Marlou Sa for him to explain it

## 2018-11-16 ENCOUNTER — Telehealth (INDEPENDENT_AMBULATORY_CARE_PROVIDER_SITE_OTHER): Payer: Self-pay | Admitting: Orthopedic Surgery

## 2018-11-16 NOTE — Telephone Encounter (Signed)
Patient called advised his schedule is open and he want to get an earlier appointment to go over the MRI. Patient said he has met his deductible and if something need to be done he can get it done before the end of the year. Patient said he is only 3 blocks away and can come at anytime to go over the MRI. The number to contact patient is [phone]

## 2018-11-16 NOTE — Telephone Encounter (Signed)
Scheduled patient with Marlou Sa tomorrow morning

## 2018-11-17 ENCOUNTER — Ambulatory Visit (INDEPENDENT_AMBULATORY_CARE_PROVIDER_SITE_OTHER): Payer: BLUE CROSS/BLUE SHIELD | Admitting: Orthopedic Surgery

## 2018-11-17 ENCOUNTER — Encounter (INDEPENDENT_AMBULATORY_CARE_PROVIDER_SITE_OTHER): Payer: Self-pay | Admitting: Orthopedic Surgery

## 2018-11-17 DIAGNOSIS — M25512 Pain in left shoulder: Secondary | ICD-10-CM

## 2018-11-17 DIAGNOSIS — G8929 Other chronic pain: Secondary | ICD-10-CM | POA: Diagnosis not present

## 2018-11-17 NOTE — Progress Notes (Signed)
Office Visit Note   Patient: [name]           Date of Birth: 1961-09-20           MRN: [id number] Visit Date: 11/17/2018 Requested by: No referring provider defined for this encounter. PCP: Leandrew Koyanagi, MD (Inactive)  Subjective: Chief Complaint  Patient presents with  . Left Shoulder - Pain, Follow-up    HPI: [name] is a patient with left shoulder pain.  He has a history of dislocation and subsequent relocation over a year ago.  MRI scan at that time is reviewed.  No rotator cuff tear was problem present.  There was some labral degeneration.  He had arthroscopic debridement done by 1 of my partners and he states that his shoulder has potentially improved some but never been quite right.  He denies any history of instability.  He has had an injection last clinic visit which helped him a lot.  Currently he is functional and not requiring any intervention.  MRI scan is reviewed with the patient and it shows some biceps tendinopathy intact rotator cuff and labral degeneration consistent with prior cuff tear.              ROS: All systems reviewed are negative as they relate to the chief complaint within the history of present illness.  Patient denies  fevers or chills.   Assessment & Plan: Visit Diagnoses:  1. Chronic left shoulder pain     Plan: Impression is right shoulder pain with biceps tendinopathy and no rotator cuff tear and no real evidence on history of recurrent instability.  I do not see a clear-cut operative problem in the left shoulder at this time.  I think his pain is likely coming from mild arthritis which is present in the glenohumeral joint as well as biceps tendinopathy.  I think the biceps tendon could be addressed surgically if his symptoms warrant.  I would favor at this time intra-articular injection if his symptoms recur.  We would get that done either arthritic either with ultrasound or with fluoroscopy.  Short of that I think he should hold off on any  surgical intervention until his symptoms worsen.  Follow-Up Instructions: Return if symptoms worsen or fail to improve.   Orders:  No orders of the defined types were placed in this encounter.  No orders of the defined types were placed in this encounter.     Procedures: No procedures performed   Clinical Data: No additional findings.  Objective: Vital Signs: There were no vitals taken for this visit.  Physical Exam:   Constitutional: Patient appears well-developed HEENT:  Head: Normocephalic Eyes:EOM are normal Neck: Normal range of motion Cardiovascular: Normal rate Pulmonary/chest: Effort normal Neurologic: Patient is alert Skin: Skin is warm Psychiatric: Patient has normal mood and affect    Ortho Exam: Ortho exam demonstrates good cervical spine range of motion.  He has excellent rotator cuff strength on the left with negative apprehension.  No discrete AC joint tenderness on the left-hand side.  Motor or sensory function to the hand is intact.  Radial pulses intact.  Cuff strength is good infraspinatus supraspinatus and subscap muscle testing.  Specialty Comments:  No specialty comments available.  Imaging: No results found.   PMFS History: Patient Active Problem List   Diagnosis Date Noted  . Loose body in knee, right 11/12/2017  . Impingement syndrome of left shoulder 11/12/2017  . Chronic pain of right knee 11/02/2017  . Chronic pain of  left knee 11/02/2017  . Mechanical pain of right knee 11/02/2017  . UC (ulcerative colitis) ([facility]) 08/18/2012  . ULCERATIVE COLITIS 04/11/2010   Past Medical History:  Diagnosis Date  . Adenomatous colon polyp   . Arthritis   . Iron deficiency anemia   . 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 12-14-14   . Ulcerative colitis     Family History  Problem Relation Age of Onset  . Colitis Mother   . Colon cancer Neg Hx     Past Surgical History:  Procedure Laterality Date  . COLONOSCOPY    .  fracture left ankle    . KNEE ARTHROSCOPY     right  . rotator cuff right shoulder     Social History   Occupational History  . Occupation: tree service  Tobacco Use  . Smoking status: Former Smoker    Last attempt to quit: 04/22/1991    Years since quitting: 27.5  . Smokeless tobacco: Never Used  Substance and Sexual Activity  . Alcohol use: No  . Drug use: No  . Sexual activity: Not on file

## 2018-12-21 ENCOUNTER — Ambulatory Visit (HOSPITAL_COMMUNITY)
Admission: EM | Admit: 2018-12-21 | Discharge: 2018-12-21 | Disposition: A | Discharge status: Home / Self Care | Payer: BLUE CROSS/BLUE SHIELD | Attending: Internal Medicine | Admitting: Internal Medicine

## 2018-12-21 ENCOUNTER — Other Ambulatory Visit: Payer: Self-pay

## 2018-12-21 DIAGNOSIS — S39011A Strain of muscle, fascia and tendon of abdomen, initial encounter: Secondary | ICD-10-CM | POA: Insufficient documentation

## 2018-12-21 LAB — POCT URINALYSIS DIP (DEVICE)
Bilirubin Urine: NEGATIVE
GLUCOSE, UA: NEGATIVE mg/dL
KETONES UR: NEGATIVE mg/dL
LEUKOCYTES UA: NEGATIVE
Nitrite: NEGATIVE
Protein, ur: NEGATIVE mg/dL
SPECIFIC GRAVITY, URINE: 1.02 (ref 1.005–1.030)
UROBILINOGEN UA: 0.2 mg/dL (ref 0.0–1.0)
pH: 6 (ref 5.0–8.0)

## 2018-12-21 MED ORDER — MELOXICAM 7.5 MG PO TABS: 7.5000 mg | ORAL_TABLET | Freq: Two times a day (BID) | ORAL | 0 refills | Status: DC | PRN

## 2018-12-21 NOTE — ED Triage Notes (Signed)
PT reports lower abdominal pain. Pain started in groin and has moved up to lower abdomen.   Denies dysuria.   Denies testicular tenderness. PT has palpated his own prostate and reports no pain.

## 2018-12-21 NOTE — ED Provider Notes (Signed)
[place]    CSN: [id number] Arrival date & time: 12/21/18  1853     History   Chief Complaint Chief Complaint  Patient presents with  . Abdominal Pain    HPI [name] is a 58 y.o. male.   58 year old male with past medical history significant for ulcerative colitis presents to urgent care complaining of suprapubic pain x1 week.  The pain occurs with movement but occasionally is colicky and pulsating even when he is sitting.  He has taken ibuprofen 800 mg twice in the last few days which relieved the pain some, but it has returned.  The patient has a very strenuous job requiring heavy lifting but he denies trauma or any particular moment that he recalls his abdomen beginning to hurt.  Notably, 2 weeks ago the patient went hunting and dragged a bear out of the woods.  He reports the bear weight 310 pounds.     Past Medical History:  Diagnosis Date  . Adenomatous colon polyp   . Arthritis   . Iron deficiency anemia   . 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 12-14-14   . Ulcerative colitis     Patient Active Problem List   Diagnosis Date Noted  . Loose body in knee, right 11/12/2017  . Impingement syndrome of left shoulder 11/12/2017  . Chronic pain of right knee 11/02/2017  . Chronic pain of left knee 11/02/2017  . Mechanical pain of right knee 11/02/2017  . UC (ulcerative colitis) ([place]) 08/18/2012  . ULCERATIVE COLITIS 04/11/2010    Past Surgical History:  Procedure Laterality Date  . COLONOSCOPY    . fracture left ankle    . KNEE ARTHROSCOPY     right  . rotator cuff right shoulder         Home Medications    Prior to Admission medications   Medication Sig Start Date End Date Taking? Authorizing Provider  mesalamine (APRISO) 0.375 g 24 hr capsule Take 4 capsules (1.5 g total) by mouth daily. 10/25/18  Yes Nandigam, Venia Minks, MD  mesalamine (CANASA) 1000 MG suppository Place 1 suppository (1,000 mg total) rectally at bedtime.  09/15/18  Yes Nandigam, Venia Minks, MD  cyanocobalamin 500 MCG tablet Take 500 mcg by mouth as needed.    [provider]  meloxicam (MOBIC) 7.5 MG tablet Take 1 tablet (7.5 mg total) by mouth 2 (two) times daily as needed for pain. Do NOT take on an empty stomach.  Drink plenty of water 12/21/18   Harrie Foreman, MD  Probiotic Product (PROBIOTIC-10) CAPS Take by mouth daily.    [provider]    Family History Family History  Problem Relation Age of Onset  . Colitis Mother   . Colon cancer Neg Hx     Social History Social History   Tobacco Use  . Smoking status: Former Smoker    Last attempt to quit: 04/22/1991    Years since quitting: 27.6  . Smokeless tobacco: Never Used  Substance Use Topics  . Alcohol use: No  . Drug use: No     Allergies   Patient has no known allergies.   Review of Systems Review of Systems  Constitutional: Negative for chills and fever.  HENT: Negative for sore throat and tinnitus.   Eyes: Negative for redness.  Respiratory: Negative for cough and shortness of breath.   Cardiovascular: Negative for chest pain and palpitations.  Gastrointestinal: Positive for abdominal pain. Negative for diarrhea, nausea and  vomiting.  Genitourinary: Negative for dysuria, frequency and urgency.  Musculoskeletal: Negative for myalgias.  Skin: Negative for rash.       No lesions  Neurological: Negative for weakness.  Hematological: Does not bruise/bleed easily.  Psychiatric/Behavioral: Negative for suicidal ideas.     Physical Exam Triage Vital Signs ED Triage Vitals  Enc Vitals Group     BP 12/21/18 1915 132/85     Pulse Rate 12/21/18 1915 77     Resp 12/21/18 1915 16     Temp 12/21/18 1915 97.8 F (36.6 C)     Temp Source 12/21/18 1915 Oral     SpO2 12/21/18 1915 100 %     Weight --      Height --      Head Circumference --      Peak Flow --      Pain Score 12/21/18 1917 7     Pain Loc --      Pain Edu? --      Excl. in [place]? --     No data found.  Updated Vital Signs BP 132/85   Pulse 77   Temp 97.8 F (36.6 C) (Oral)   Resp 16   SpO2 100%   Visual Acuity Right Eye Distance:   Left Eye Distance:   Bilateral Distance:    Right Eye Near:   Left Eye Near:    Bilateral Near:     Physical Exam Constitutional:      General: He is not in acute distress.    Appearance: He is well-developed.  HENT:     Head: Normocephalic and atraumatic.  Eyes:     General: No scleral icterus.    Conjunctiva/sclera: Conjunctivae normal.     Pupils: Pupils are equal, round, and reactive to light.  Neck:     Musculoskeletal: Normal range of motion and neck supple.     Thyroid: No thyromegaly.     Vascular: No JVD.     Trachea: No tracheal deviation.  Cardiovascular:     Rate and Rhythm: Normal rate and regular rhythm.     Heart sounds: Normal heart sounds. No murmur. No friction rub. No gallop.   Pulmonary:     Effort: Pulmonary effort is normal. No respiratory distress.     Breath sounds: Normal breath sounds.  Abdominal:     General: Bowel sounds are normal. There is no distension.     Palpations: Abdomen is soft.     Tenderness: There is abdominal tenderness in the suprapubic area. There is no guarding or rebound. Negative signs include psoas sign.     Hernia: A hernia (The patient has perineal laxity bilaterally on hernia exam but no pain) is present. There is no hernia in the umbilical area or ventral area.  Musculoskeletal: Normal range of motion.  Lymphadenopathy:     Cervical: No cervical adenopathy.  Skin:    General: Skin is warm and dry.     Findings: No erythema or rash.  Neurological:     Mental Status: He is alert and oriented to person, place, and time.     Cranial Nerves: No cranial nerve deficit.  Psychiatric:        Behavior: Behavior normal.        Thought Content: Thought content normal.        Judgment: Judgment normal.      UC Treatments / Results  Labs (all labs ordered are  listed, but only abnormal results are displayed) Labs Reviewed  POCT URINALYSIS DIP (DEVICE) - Abnormal; Notable for the following components:      Result Value   Hgb urine dipstick TRACE (*)    All other components within normal limits    EKG None  Radiology No results found.  Procedures Procedures (including critical care time)  Medications Ordered in UC Medications - No data to display  Initial Impression / Assessment and Plan / UC Course  I have reviewed the triage vital signs and the nursing notes.  Pertinent labs & imaging results that were available during my care of the patient were reviewed by me and considered in my medical decision making (see chart for details).     Patient demonstrated body position as he was helping to drag the bare from the woods with a rope which may or may not have been pressing on his abdomen.  Likely bruising or muscle strain secondary to this activity.  However the patient may also have some pelvic floor weakness resulting in referred pain.  Advised 2-week rest from any strenuous activity including heavy lifting.  Contact information for general surgery clinic if symptoms are not improved in 2 weeks with rest and NSAID therapy.  Final Clinical Impressions(s) / UC Diagnoses   Final diagnoses:  Strain of abdominal muscle, initial encounter   Discharge Instructions   None    ED Prescriptions    Medication Sig Dispense Auth. Provider   meloxicam (MOBIC) 7.5 MG tablet Take 1 tablet (7.5 mg total) by mouth 2 (two) times daily as needed for pain. Do NOT take on an empty stomach.  Drink plenty of water 28 tablet Harrie Foreman, MD     Controlled Substance Prescriptions [place] Controlled Substance Registry consulted? Not Applicable   Harrie Foreman, MD 12/21/18 2059

## 2019-05-05 ENCOUNTER — Telehealth: Payer: Self-pay | Admitting: Gastroenterology

## 2019-05-05 NOTE — Telephone Encounter (Signed)
Pt stated that [facility] requires PA for [employer],   Pharmacy # [phone]

## 2019-05-05 NOTE — Telephone Encounter (Signed)
Pt insurance called and she wanted to advised that he pt medication Apriso .389m has been approved and she will be faxing over the pt approval letter.

## 2019-05-05 NOTE — Telephone Encounter (Signed)
Offered patient samples until his prior [name] is done for his [name]  He will pick up today

## 2019-05-05 NOTE — Telephone Encounter (Signed)
Received approval from [employer] and [employer] on patients Apriso   Effective dates are 05/05/2019-05/03/2022 Reference number is [id number]

## 2019-05-05 NOTE — Telephone Encounter (Signed)
I have received approval letter and sent to be scanned in

## 2019-06-16 ENCOUNTER — Telehealth: Payer: Self-pay | Admitting: Gastroenterology

## 2019-06-16 MED ORDER — MESALAMINE ER 0.375 G PO CP24: 1.5000 g | ORAL_CAPSULE | Freq: Every day | ORAL | 6 refills | Status: DC

## 2019-06-16 NOTE — Telephone Encounter (Signed)
Left message for patient that med was sent and to check with [facility] to see what the cost is

## 2019-06-16 NOTE — Telephone Encounter (Signed)
Patient called and wanting to speak with the nurse to see if he can get the generic brand of the samples that were given to him called in to the [facility] on [address] at [phone]. With his insurance the [name] will still cost 500.00. so he is needing to have  The mesalamine .375 mg called in instead.

## 2019-06-21 MED ORDER — MESALAMINE ER 0.375 G PO CP24: 1.5000 g | ORAL_CAPSULE | Freq: Every day | ORAL | 6 refills | Status: DC

## 2019-06-21 NOTE — Telephone Encounter (Signed)
Pt stated that he has never shopped at [facility] and requested prescription for mesalamine generic to be sent to [facility] on [address].  Phone # [phone]

## 2019-06-21 NOTE — Telephone Encounter (Signed)
[facility] was listed as his pharmacy.... resent to [facility] on script generic only

## 2019-06-21 NOTE — Addendum Note (Signed)
Addended by: Oda Kilts on: 06/21/2019 10:32 AM   Modules accepted: Orders

## 2019-07-16 ENCOUNTER — Telehealth: Payer: Self-pay

## 2019-07-16 NOTE — Telephone Encounter (Signed)
Wife called about testing. Addresses provided. Pt has ulcerative colitis. Wife advised to get husband tested for Covid 19.

## 2019-08-09 ENCOUNTER — Other Ambulatory Visit: Payer: Self-pay

## 2019-08-09 DIAGNOSIS — Z20822 Contact with and (suspected) exposure to covid-19: Secondary | ICD-10-CM

## 2019-08-11 ENCOUNTER — Telehealth: Payer: Self-pay | Admitting: General Practice

## 2019-08-11 LAB — NOVEL CORONAVIRUS, NAA: SARS-CoV-2, NAA: NOT DETECTED

## 2019-08-11 NOTE — Telephone Encounter (Signed)
Patient called for the results of his Covid-19 test.  He was told that Covid was Not Detected.

## 2019-08-22 ENCOUNTER — Encounter: Payer: Self-pay | Admitting: Gastroenterology

## 2019-12-05 ENCOUNTER — Ambulatory Visit: Payer: BC Managed Care – PPO | Attending: Internal Medicine

## 2019-12-05 DIAGNOSIS — Z20822 Contact with and (suspected) exposure to covid-19: Secondary | ICD-10-CM

## 2019-12-07 ENCOUNTER — Telehealth: Payer: Self-pay

## 2019-12-07 LAB — NOVEL CORONAVIRUS, NAA: SARS-CoV-2, NAA: NOT DETECTED

## 2019-12-07 NOTE — Telephone Encounter (Signed)
Caller given negative result and verbalized understanding

## 2020-10-24 ENCOUNTER — Other Ambulatory Visit: Payer: Self-pay

## 2020-10-24 ENCOUNTER — Other Ambulatory Visit (INDEPENDENT_AMBULATORY_CARE_PROVIDER_SITE_OTHER): Payer: BC Managed Care – PPO

## 2020-10-24 DIAGNOSIS — K5100 Ulcerative (chronic) pancolitis without complications: Secondary | ICD-10-CM

## 2020-10-24 LAB — B12 AND FOLATE PANEL
Folate: >23.6 ng/mL (ref >5.9)
Vitamin B-12: 721 pg/mL (ref 211–911)

## 2020-10-24 LAB — COMPREHENSIVE METABOLIC PANEL
ALT: 13 U/L (ref 0–53)
AST: 15 U/L (ref 0–37)
Albumin: 4.2 g/dL (ref 3.5–5.2)
Alkaline Phosphatase: 94 U/L (ref 39–117)
BUN: 23 mg/dL (ref 6–23)
CO2: 29 mEq/L (ref 19–32)
Calcium: 9.3 mg/dL (ref 8.4–10.5)
Chloride: 103 mEq/L (ref 96–112)
Creatinine, Ser: 0.81 mg/dL (ref 0.40–1.50)
GFR: 97.94 mL/min (ref >60.00)
Glucose, Bld: 97 mg/dL (ref 70–99)
Potassium: 4 mEq/L (ref 3.5–5.1)
Sodium: 137 mEq/L (ref 135–145)
Total Bilirubin: 0.5 mg/dL (ref 0.2–1.2)
Total Protein: 7.2 g/dL (ref 6.0–8.3)

## 2020-10-24 LAB — CBC WITH DIFFERENTIAL/PLATELET
Basophils Absolute: 0.1 10*3/uL (ref 0.0–0.1)
Basophils Relative: 1 % (ref 0.0–3.0)
Eosinophils Absolute: 0.3 10*3/uL (ref 0.0–0.7)
Eosinophils Relative: 3.5 % (ref 0.0–5.0)
HCT: 46.9 % (ref 39.0–52.0)
Hemoglobin: 15.9 g/dL (ref 13.0–17.0)
Lymphocytes Relative: 17 % (ref 12.0–46.0)
Lymphs Abs: 1.3 10*3/uL (ref 0.7–4.0)
MCHC: 33.9 g/dL (ref 30.0–36.0)
MCV: 88.8 fl (ref 78.0–100.0)
Monocytes Absolute: 0.6 10*3/uL (ref 0.1–1.0)
Monocytes Relative: 8.5 % (ref 3.0–12.0)
Neutro Abs: 5.3 10*3/uL (ref 1.4–7.7)
Neutrophils Relative %: 70 % (ref 43.0–77.0)
Platelets: 257 10*3/uL (ref 150.0–400.0)
RBC: 5.28 Mil/uL (ref 4.22–5.81)
RDW: 14.1 % (ref 11.5–15.5)
WBC: 7.6 10*3/uL (ref 4.0–10.5)

## 2020-10-24 LAB — FERRITIN: Ferritin: 93.8 ng/mL (ref 22.0–322.0)

## 2020-10-24 LAB — HIGH SENSITIVITY CRP: CRP, High Sensitivity: 4.7 mg/L (ref 0.000–5.000)

## 2020-10-24 LAB — SEDIMENTATION RATE: Sed Rate: 22 mm/hr — ABNORMAL HIGH (ref 0–20)

## 2020-10-30 ENCOUNTER — Telehealth: Payer: Self-pay

## 2020-10-30 ENCOUNTER — Other Ambulatory Visit: Payer: Self-pay

## 2020-10-30 MED ORDER — MESALAMINE ER 0.375 G PO CP24: 1.5000 g | ORAL_CAPSULE | Freq: Every day | ORAL | 3 refills | Status: DC

## 2020-10-30 NOTE — Telephone Encounter (Signed)
Apriso sent to patients preferred [facility]   L/m for pt that med sent

## 2020-10-30 NOTE — Telephone Encounter (Signed)
Patient needs Mesalamine samples if available. If no samples, he asks for a prescription to the [facility].  He ran out of medication about 2 weeks ago. He has felt fine until yesterday. His stomach felt a little uncomfortable. He does not think he is "in trouble" meaning a flare of the UC. No blood or mucous with his stools. He has Canasa suppositories which he has used today.  He is aware of his labs. He wants to see his provider first. Appointment is in January.  Please advise.

## 2020-10-30 NOTE — Telephone Encounter (Signed)
Jori Moll, can you send him Rx for Mesalamine (Apriso) 1.5gm daily, 3 refills. Thanks

## 2020-10-31 ENCOUNTER — Telehealth: Payer: Self-pay | Admitting: Gastroenterology

## 2020-10-31 MED ORDER — MESALAMINE 1000 MG RE SUPP: 1000.0000 mg | Freq: Every day | RECTAL | 3 refills | Status: DC

## 2020-10-31 MED ORDER — HYDROCORTISONE (PERIANAL) 2.5 % EX CREA: 1.0000 "application " | TOPICAL_CREAM | Freq: Two times a day (BID) | CUTANEOUS | 1 refills | Status: DC

## 2020-10-31 NOTE — Telephone Encounter (Signed)
Okay to refill both.  Please make sure he has follow-up appointment scheduled for next available office visit.  Thank you

## 2020-10-31 NOTE — Telephone Encounter (Signed)
Pt would like to rf Veracity HC pramoxine rectal cream 2.5% and canasa suppositories. He uses [facility] [address]

## 2020-10-31 NOTE — Telephone Encounter (Signed)
Dr Silverio Decamp are these ok to give him, I do not see them on his current med list   Thanks

## 2020-10-31 NOTE — Telephone Encounter (Signed)
Filled requested medications, Patient has a follow up appointment on Jan 5th

## 2020-12-19 ENCOUNTER — Ambulatory Visit: Payer: BC Managed Care – PPO | Admitting: Gastroenterology

## 2020-12-19 ENCOUNTER — Encounter: Payer: Self-pay | Admitting: Gastroenterology

## 2020-12-19 VITALS — BP 98/70 | HR 92 | Ht 67.5 in | Wt 178.4 lb

## 2020-12-19 DIAGNOSIS — K649 Unspecified hemorrhoids: Secondary | ICD-10-CM | POA: Diagnosis not present

## 2020-12-19 DIAGNOSIS — K5180 Other ulcerative colitis without complications: Secondary | ICD-10-CM | POA: Diagnosis not present

## 2020-12-19 NOTE — Patient Instructions (Addendum)
You have been scheduled for a bone density test on  12/24/2020 at  9:30am. Please arrive 15 minutes prior to your scheduled appointment to radiology on the basement floor of [place] location for this test. If you need to cancel or reschedule for any reason, please contact radiology at [phone].  Preparation for test is as follows:   If you are taking calcium, discontinue this 24-48 hours prior to your appointment.   Wear pants with an elastic waistband (or without any metal such as a zipper).   Do not wear an underwire bra.   We do have gowns if you are unable to find appropriate clothing without metal.   Please bring a list of all current medications.   Follow up for 6 months  I appreciate the  opportunity to care for you  Thank You   Harl Bowie , MD

## 2020-12-24 ENCOUNTER — Ambulatory Visit (INDEPENDENT_AMBULATORY_CARE_PROVIDER_SITE_OTHER)
Admission: RE | Admit: 2020-12-24 | Discharge: 2020-12-24 | Disposition: A | Discharge status: Home / Self Care | Payer: BC Managed Care – PPO | Source: Ambulatory Visit | Attending: Gastroenterology | Admitting: Gastroenterology

## 2020-12-24 ENCOUNTER — Other Ambulatory Visit: Payer: Self-pay

## 2020-12-24 DIAGNOSIS — K649 Unspecified hemorrhoids: Secondary | ICD-10-CM | POA: Diagnosis not present

## 2020-12-24 DIAGNOSIS — K5180 Other ulcerative colitis without complications: Secondary | ICD-10-CM | POA: Diagnosis not present

## 2020-12-27 DIAGNOSIS — K649 Unspecified hemorrhoids: Secondary | ICD-10-CM | POA: Diagnosis not present

## 2020-12-27 DIAGNOSIS — K5180 Other ulcerative colitis without complications: Secondary | ICD-10-CM | POA: Diagnosis not present

## 2020-12-28 ENCOUNTER — Other Ambulatory Visit: Payer: Self-pay

## 2020-12-28 DIAGNOSIS — R7989 Other specified abnormal findings of blood chemistry: Secondary | ICD-10-CM

## 2021-01-01 ENCOUNTER — Encounter: Payer: Self-pay | Admitting: Gastroenterology

## 2021-01-01 NOTE — Progress Notes (Signed)
[name]    [id number]    04-12-1961  Primary Care Physician:Patient, No Pcp Per  Referring Physician: No referring provider defined for this encounter.   Chief complaint: Ulcerative colitis  HPI:  60 yr old very pleasant gentleman with history of pancolonic ulcerative colitis diagnosed in 1995 here for follow-up visit.   Had an episode mild acute flare, had run out of mesalamine.  He restarted using mesalamine and also Canasa suppositories 3 times a week with significant improvement of symptoms.  He is no longer having rectal discomfort, mucus or bleeding.  His bowel habits are regular.  Overall doing well Denies any nausea, vomiting, abdominal pain, melena or bright red blood per rectum                                                                                                             Reviewed recent labs CBC, CMP, iron panel, ferritin, folate, B12 and CRP within normal limits. TB QuantiFERON gold negative  Relevant GI Hx:  He was previously maintained on [name],  noted undetectable [name] drug level with elevated antibody >160 on labs on April 12, 2018.    Subsequently discontinued [name].  Colonoscopy September 18, 2017 showed altered vascular pattern, inactive colitis, 9 mm polyp removed from rectum with hot snare. Based on pathology, biopsies from the left colon showed minimally active colitis. Right colon biopsies showed normal mucosa. The polyp was hyperplastic. Recall colonoscopy in 2 years for surveillance   Outpatient Encounter Medications as of 12/19/2020  Medication Sig  . cyanocobalamin 500 MCG tablet Take 500 mcg by mouth as needed.  . hydrocortisone (ANUSOL-HC) 2.5 % rectal cream Place 1 application rectally 2 (two) times daily.  . meloxicam (MOBIC) 7.5 MG tablet Take 1 tablet (7.5 mg total) by mouth 2 (two) times daily as needed for pain. Do NOT take on an empty stomach.  Drink plenty of water  . mesalamine (APRISO) 0.375 g 24 hr  capsule Take 4 capsules (1.5 g total) by mouth daily. Generic only  . mesalamine (CANASA) 1000 MG suppository Place 1 suppository (1,000 mg total) rectally at bedtime. As needed (Patient taking differently: Place 1,000 mg rectally as needed. As needed)  . Probiotic Product (PROBIOTIC-10) CAPS Take by mouth daily.   No facility-administered encounter medications on file as of 12/19/2020.    Allergies as of 12/19/2020  . (No Known Allergies)    Past Medical History:  Diagnosis Date  . Adenomatous colon polyp   . Arthritis   . Iron deficiency anemia   . 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 12-14-14   . Ulcerative colitis     Past Surgical History:  Procedure Laterality Date  . COLONOSCOPY    . fracture left ankle    . KNEE ARTHROSCOPY     right  . rotator cuff right shoulder      Family History  Problem Relation Age of Onset  . Colitis Mother   . Colon cancer Neg  Hx     Social History   Socioeconomic History  . Marital status: Married    Spouse name: Not on file  . Number of children: 4  . Years of education: Not on file  . Highest education level: Not on file  Occupational History  . Occupation: tree service  Tobacco Use  . Smoking status: Former Smoker    Quit date: 04/22/1991    Years since quitting: 29.7  . Smokeless tobacco: Never Used  Substance and Sexual Activity  . Alcohol use: No  . Drug use: No  . Sexual activity: Not on file  Other Topics Concern  . Not on file  Social History Narrative  . Not on file   Social Determinants of Health   Financial Resource Strain: Not on file  Food Insecurity: Not on file  Transportation Needs: Not on file  Physical Activity: Not on file  Stress: Not on file  Social Connections: Not on file  Intimate Partner Violence: Not on file      Review of systems: All other review of systems negative except as mentioned in the HPI.   Physical Exam: Vitals:   12/19/20 1341  BP: 98/70  Pulse: 92    Body mass index is 27.53 kg/m. Gen:      No acute distress HEENT:  sclera anicteric Abd:      soft, non-tender; no palpable masses, no distension Ext:    No edema Neuro: alert and oriented x 3 Psych: normal mood and affect  Data Reviewed:  Reviewed labs, radiology imaging, old records and pertinent past GI work up   Assessment and Plan/Recommendations:  60 year old pleasant gentleman with history of pancolonic ulcerative colitis initially diagnosed in 1995, was on [name] until 2019 which was discontinued due to significantly elevated antibodies with undetectable drug level  Continues to remain in clinical remission  Continue mesalamine daily  Use Canasa suppositories as needed  IBD health maintenance: Due for DEXA bone scan, will schedule at  Declined flu vaccination.  He is also hesitant to get COVID vaccination, worried about potential side effects.  Return in 1 year or sooner if needed  This visit required 30 minutes of patient care (this includes precharting, chart review, review of results, face-to-face time used for counseling as well as treatment plan and follow-up. The patient was provided an opportunity to ask questions and all were answered. The patient agreed with the plan and demonstrated an understanding of the instructions.  Damaris Hippo , MD    CC: No ref. provider found

## 2021-04-14 ENCOUNTER — Other Ambulatory Visit: Payer: Self-pay | Admitting: Gastroenterology

## 2021-04-15 ENCOUNTER — Other Ambulatory Visit: Payer: Self-pay | Admitting: Gastroenterology

## 2021-11-14 ENCOUNTER — Other Ambulatory Visit: Payer: Self-pay

## 2021-11-18 ENCOUNTER — Telehealth: Payer: Self-pay

## 2021-11-18 NOTE — Telephone Encounter (Signed)
Pt came into the office to let Dr. Sharol Given know that he made an apt with [facility] to see his elbow and just wanted to keep Sharol Given in the loop since he was his primary Educational psychologist

## 2021-11-18 NOTE — Telephone Encounter (Signed)
Noted  

## 2021-11-21 ENCOUNTER — Other Ambulatory Visit: Payer: Self-pay

## 2021-11-21 ENCOUNTER — Encounter: Payer: Self-pay | Admitting: Orthopedic Surgery

## 2021-11-21 ENCOUNTER — Ambulatory Visit (INDEPENDENT_AMBULATORY_CARE_PROVIDER_SITE_OTHER): Payer: BC Managed Care – PPO | Admitting: Orthopedic Surgery

## 2021-11-21 ENCOUNTER — Ambulatory Visit: Payer: Self-pay

## 2021-11-21 DIAGNOSIS — M7712 Lateral epicondylitis, left elbow: Secondary | ICD-10-CM

## 2021-11-21 DIAGNOSIS — M25522 Pain in left elbow: Secondary | ICD-10-CM | POA: Diagnosis not present

## 2021-11-21 NOTE — Progress Notes (Signed)
Office Visit Note   Patient: [name]           Date of Birth: February 14, 1961           MRN: [id number] Visit Date: 11/21/2021              Requested by: No referring provider defined for this encounter. PCP: Patient, No Pcp Per (Inactive)   Assessment & Plan: Visit Diagnoses:  1. Pain in left elbow   2. Lateral epicondylitis, left elbow     Plan: We discussed the diagnosis, prognosis, non-operative and operative treatment options for lateral epicondylitis.  After our discussion, the patient would like to proceed with topical NSAID use and a home exercise/rehab program.  We reviewed the risks and benefits of conservative management.  The patient expressed understanding of the reasoning and strategy going forward.  All patient questions and concerns were addressed.    Follow-Up Instructions: No follow-ups on file.   Orders:  Orders Placed This Encounter  Procedures   XR Elbow Complete Left (3+View)   No orders of the defined types were placed in this encounter.     Procedures: No procedures performed   Clinical Data: No additional findings.   Subjective: Chief Complaint  Patient presents with   Left Elbow - Pain    This is a 60 year old right-hand-dominant male who presents with left elbow pain for the last year or so.  He runs a tree removal service.  He was at work when he had a rotational movement with extension of the elbow and felt a pop.  Since that time he had intermittent pain at the lateral aspect of this left elbow that is worse with prolonged activity.  He does not have pain at rest.  He has some days where he has no pain at all.  His pain is when he is very busy at work.  He is tried a tennis elbow strap which is helped some.  He is taking CBD oil which is helped some.  Tries Voltaren gel.  He never had any pain in his elbow prior to the event at work.  He denies any clicking, popping, or catching in the elbow.  He has full and painless range of  motion.   Review of Systems   Objective: Vital Signs: There were no vitals taken for this visit.  Physical Exam Constitutional:      Appearance: Normal appearance.  Cardiovascular:     Rate and Rhythm: Normal rate.     Pulses: Normal pulses.  Pulmonary:     Effort: Pulmonary effort is normal.  Skin:    General: Skin is warm and dry.     Capillary Refill: Capillary refill takes less than 2 seconds.  Neurological:     Mental Status: He is alert.    Left Elbow Exam   Tenderness  The patient is experiencing tenderness in the lateral epicondyle.   Range of Motion  The patient has normal left elbow ROM.  Muscle Strength  The patient has normal left elbow strength.  Tests  Varus: negative Valgus: negative  Other  Erythema: absent Sensation: normal Pulse: present  Comments:  TTP directly over lateral epicondyle.  Pain w/ resisted wrist and middle finger extension with elbow extended.       Specialty Comments:  No specialty comments available.  Imaging: 3 views of left elbow taken today reviewed interpreted by me.  They demonstrate well-maintained joint spaces with minimal osteophytes.  There is no evidence of fracture,  dislocation, or instability.   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 12-14-14    Ulcerative colitis     Family History  Problem Relation Age of Onset   Colitis Mother    Colon cancer Neg Hx     Past Surgical  History:  Procedure Laterality Date   COLONOSCOPY     fracture left ankle     KNEE ARTHROSCOPY     right   rotator cuff right shoulder     Social History   Occupational History   Occupation: tree service  Tobacco Use   Smoking status: Former    Types: Cigarettes    Quit date: 04/22/1991    Years since quitting: 30.6   Smokeless tobacco: Never  Substance and Sexual Activity   Alcohol use: No   Drug use: No   Sexual activity: Not on file

## 2021-12-19 ENCOUNTER — Ambulatory Visit: Payer: BC Managed Care – PPO | Admitting: Orthopedic Surgery

## 2022-01-09 ENCOUNTER — Ambulatory Visit: Payer: BC Managed Care – PPO | Admitting: Orthopedic Surgery

## 2022-01-24 ENCOUNTER — Encounter: Payer: Self-pay | Admitting: Orthopedic Surgery

## 2022-01-24 ENCOUNTER — Ambulatory Visit: Payer: BC Managed Care – PPO | Admitting: Orthopedic Surgery

## 2022-01-24 DIAGNOSIS — M7712 Lateral epicondylitis, left elbow: Secondary | ICD-10-CM

## 2022-01-24 NOTE — Progress Notes (Signed)
Office Visit Note   Patient: [name]           Date of Birth: 1961-11-15           MRN: [id number] Visit Date: 01/24/2022              Requested by: No referring provider defined for this encounter. PCP: Patient, No Pcp Per (Inactive)   Assessment & Plan: Visit Diagnoses:  1. Lateral epicondylitis, left elbow     Plan: Patient has been in therapy and notes that he is at least 65% better.  The pain at the lateral aspect of the elbow has much improved.  Is able to work without difficulty.  He is still doing the home exercise program as instructed.  He is overall very pleased with his progress.  He can come back to the office if he regresses or fails to improve.   Follow-Up Instructions: No follow-ups on file.   Orders:  No orders of the defined types were placed in this encounter.  No orders of the defined types were placed in this encounter.     Procedures: No procedures performed   Clinical Data: No additional findings.   Subjective: Chief Complaint  Patient presents with   Left Elbow - Follow-up    This is a 61 year old right-hand-dominant male who presents for follow up of left lateral epicondylitis.  He runs a tree removal service.  He was last seen at the beginning of December at which time we started him in therapy to work on strengthening, stretching, and range of motion.  He says that he is at least 65% better if not more.  He is happy with his progress.  He is doing a home exercise program every day.   Review of Systems   Objective: Vital Signs: There were no vitals taken for this visit.  Physical Exam  Left Elbow Exam   Tenderness  The patient is experiencing no tenderness.   Range of Motion  The patient has normal left elbow ROM.  Muscle Strength  The patient has normal left elbow strength.  Other  Erythema: absent Sensation: normal Pulse: present  Comments:  Mild pain w/ resisted middle finger extension with elbow extended.       Specialty Comments:  No specialty comments available.  Imaging: No results found.   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 12-14-14    Ulcerative colitis     Family History  Problem Relation Age of Onset   Colitis Mother    Colon cancer Neg Hx     Past Surgical History:  Procedure Laterality Date   COLONOSCOPY     fracture left ankle     KNEE ARTHROSCOPY     right   rotator cuff right shoulder     Social History   Occupational History   Occupation: tree service  Tobacco Use   Smoking status: Former  Types: Cigarettes    Quit date: 04/22/1991    Years since quitting: 30.7   Smokeless tobacco: Never  Substance and Sexual Activity   Alcohol use: No   Drug use: No   Sexual activity: Not on file

## 2022-04-11 ENCOUNTER — Other Ambulatory Visit: Payer: Self-pay | Admitting: Gastroenterology

## 2022-06-23 ENCOUNTER — Telehealth: Payer: Self-pay | Admitting: *Deleted

## 2022-06-23 NOTE — Telephone Encounter (Signed)
Patient stopped in clinic around 4:55 pm on 06/23/22 to speak with [facility], LPN.   Beth please call patient tomorrow.
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
## 2022-06-24 NOTE — Telephone Encounter (Signed)
Called the patient today. No answer. Left him a message of my return call. Past due follow up appointment with Dr Silverio Decamp. We can do labs 1 week prior to the appointment.

## 2022-09-11 DIAGNOSIS — S39011A Strain of muscle, fascia and tendon of abdomen, initial encounter: Secondary | ICD-10-CM | POA: Diagnosis not present

## 2022-09-11 DIAGNOSIS — Z6830 Body mass index (BMI) 30.0-30.9, adult: Secondary | ICD-10-CM | POA: Diagnosis not present

## 2022-09-11 DIAGNOSIS — R030 Elevated blood-pressure reading, without diagnosis of hypertension: Secondary | ICD-10-CM | POA: Diagnosis not present

## 2022-09-11 DIAGNOSIS — Z0130 Encounter for examination of blood pressure without abnormal findings: Secondary | ICD-10-CM | POA: Diagnosis not present

## 2022-09-11 DIAGNOSIS — R1030 Lower abdominal pain, unspecified: Secondary | ICD-10-CM | POA: Diagnosis not present

## 2022-10-01 ENCOUNTER — Other Ambulatory Visit: Payer: Self-pay | Admitting: Gastroenterology

## 2022-10-03 ENCOUNTER — Other Ambulatory Visit: Payer: Self-pay

## 2022-10-03 ENCOUNTER — Telehealth: Payer: Self-pay

## 2022-10-03 DIAGNOSIS — K5180 Other ulcerative colitis without complications: Secondary | ICD-10-CM

## 2022-10-03 DIAGNOSIS — R7989 Other specified abnormal findings of blood chemistry: Secondary | ICD-10-CM

## 2022-10-03 DIAGNOSIS — Z1388 Encounter for screening for disorder due to exposure to contaminants: Secondary | ICD-10-CM

## 2022-10-03 MED ORDER — MESALAMINE ER 0.375 G PO CP24: 1.5000 g | ORAL_CAPSULE | Freq: Every day | ORAL | 3 refills | Status: AC

## 2022-10-03 NOTE — Telephone Encounter (Signed)
Ok, its fine to check lead level along with CBC, CMP, ESR, CRP, Vit D, TSH, iron panel and fecal calprotectin. Thanks

## 2022-10-03 NOTE — Telephone Encounter (Signed)
Patient notified

## 2022-10-03 NOTE — Telephone Encounter (Signed)
Called the patient to discuss follow up appointment for IBD with Dr Silverio Decamp. He reports he is doing well and has not had any problems. He was last seen 12/19/20. Patient will be scheduled in the first available opening and understands the January schedule has not been released yet. I will monitor for any cancellations. He agrees to this. He likes to have his labs done before he is seen if you agree. He also asks if you would check a lead level on him.

## 2022-10-21 ENCOUNTER — Other Ambulatory Visit (INDEPENDENT_AMBULATORY_CARE_PROVIDER_SITE_OTHER): Payer: BC Managed Care – PPO

## 2022-10-21 DIAGNOSIS — R7989 Other specified abnormal findings of blood chemistry: Secondary | ICD-10-CM | POA: Diagnosis not present

## 2022-10-21 DIAGNOSIS — Z1388 Encounter for screening for disorder due to exposure to contaminants: Secondary | ICD-10-CM | POA: Diagnosis not present

## 2022-10-21 DIAGNOSIS — K5180 Other ulcerative colitis without complications: Secondary | ICD-10-CM | POA: Diagnosis not present

## 2022-10-21 LAB — CBC WITH DIFFERENTIAL/PLATELET
Basophils Absolute: 0.1 10*3/uL (ref 0.0–0.1)
Basophils Relative: 1 % (ref 0.0–3.0)
Eosinophils Absolute: 0.3 10*3/uL (ref 0.0–0.7)
Eosinophils Relative: 5 % (ref 0.0–5.0)
HCT: 44.2 % (ref 39.0–52.0)
Hemoglobin: 14.8 g/dL (ref 13.0–17.0)
Lymphocytes Relative: 21.7 % (ref 12.0–46.0)
Lymphs Abs: 1.5 10*3/uL (ref 0.7–4.0)
MCHC: 33.6 g/dL (ref 30.0–36.0)
MCV: 87.9 fl (ref 78.0–100.0)
Monocytes Absolute: 0.6 10*3/uL (ref 0.1–1.0)
Monocytes Relative: 8.4 % (ref 3.0–12.0)
Neutro Abs: 4.3 10*3/uL (ref 1.4–7.7)
Neutrophils Relative %: 63.9 % (ref 43.0–77.0)
Platelets: 267 10*3/uL (ref 150.0–400.0)
RBC: 5.02 Mil/uL (ref 4.22–5.81)
RDW: 13.7 % (ref 11.5–15.5)
WBC: 6.7 10*3/uL (ref 4.0–10.5)

## 2022-10-21 LAB — COMPREHENSIVE METABOLIC PANEL
ALT: 15 U/L (ref 0–53)
AST: 14 U/L (ref 0–37)
Albumin: 4.1 g/dL (ref 3.5–5.2)
Alkaline Phosphatase: 88 U/L (ref 39–117)
BUN: 19 mg/dL (ref 6–23)
CO2: 27 mEq/L (ref 19–32)
Calcium: 9.3 mg/dL (ref 8.4–10.5)
Chloride: 104 mEq/L (ref 96–112)
Creatinine, Ser: 0.85 mg/dL (ref 0.40–1.50)
GFR: 95.18 mL/min (ref >60.00)
Glucose, Bld: 105 mg/dL — ABNORMAL HIGH (ref 70–99)
Potassium: 4.1 mEq/L (ref 3.5–5.1)
Sodium: 137 mEq/L (ref 135–145)
Total Bilirubin: 0.4 mg/dL (ref 0.2–1.2)
Total Protein: 7.3 g/dL (ref 6.0–8.3)

## 2022-10-21 LAB — TSH: TSH: 1.26 u[IU]/mL (ref 0.35–5.50)

## 2022-10-21 LAB — VITAMIN D 25 HYDROXY (VIT D DEFICIENCY, FRACTURES): VITD: 34.5 ng/mL (ref 30.00–100.00)

## 2022-10-21 LAB — HIGH SENSITIVITY CRP: CRP, High Sensitivity: 5.62 mg/L — ABNORMAL HIGH (ref 0.000–5.000)

## 2022-10-21 LAB — SEDIMENTATION RATE: Sed Rate: 30 mm/hr — ABNORMAL HIGH (ref 0–20)

## 2022-10-24 LAB — CALPROTECTIN

## 2022-10-24 LAB — IRON,TIBC AND FERRITIN PANEL
%SAT: 19 % (calc) — ABNORMAL LOW (ref 20–48)
Ferritin: 98 ng/mL (ref 38–380)
Iron: 52 ug/dL (ref 50–180)
TIBC: 278 mcg/dL (calc) (ref 250–425)

## 2022-10-24 LAB — LEAD, BLOOD (ADULT >= 16 YRS): Lead: 1.1 ug/dL (ref <3.5)

## 2022-11-20 DIAGNOSIS — L089 Local infection of the skin and subcutaneous tissue, unspecified: Secondary | ICD-10-CM | POA: Diagnosis not present

## 2022-11-20 DIAGNOSIS — R030 Elevated blood-pressure reading, without diagnosis of hypertension: Secondary | ICD-10-CM | POA: Diagnosis not present

## 2022-11-20 DIAGNOSIS — M62838 Other muscle spasm: Secondary | ICD-10-CM | POA: Diagnosis not present

## 2022-11-20 DIAGNOSIS — Z6828 Body mass index (BMI) 28.0-28.9, adult: Secondary | ICD-10-CM | POA: Diagnosis not present

## 2022-11-20 DIAGNOSIS — B9562 Methicillin resistant Staphylococcus aureus infection as the cause of diseases classified elsewhere: Secondary | ICD-10-CM | POA: Diagnosis not present

## 2022-11-20 DIAGNOSIS — Z0130 Encounter for examination of blood pressure without abnormal findings: Secondary | ICD-10-CM | POA: Diagnosis not present

## 2022-11-20 DIAGNOSIS — R0602 Shortness of breath: Secondary | ICD-10-CM | POA: Diagnosis not present

## 2022-11-26 ENCOUNTER — Telehealth: Payer: Self-pay | Admitting: *Deleted

## 2022-11-26 NOTE — Telephone Encounter (Signed)
Called and left patient a message about his labs and he needed an office appointment. I have scheduled him first available follow up for 02/05/2023 at 2:30pm for UC follow up. Patient is past due   See result note

## 2022-12-11 DIAGNOSIS — Z6828 Body mass index (BMI) 28.0-28.9, adult: Secondary | ICD-10-CM | POA: Diagnosis not present

## 2022-12-11 DIAGNOSIS — M62838 Other muscle spasm: Secondary | ICD-10-CM | POA: Diagnosis not present

## 2022-12-11 DIAGNOSIS — Z0130 Encounter for examination of blood pressure without abnormal findings: Secondary | ICD-10-CM | POA: Diagnosis not present

## 2022-12-11 DIAGNOSIS — S46812A Strain of other muscles, fascia and tendons at shoulder and upper arm level, left arm, initial encounter: Secondary | ICD-10-CM | POA: Diagnosis not present

## 2022-12-11 DIAGNOSIS — K611 Rectal abscess: Secondary | ICD-10-CM | POA: Diagnosis not present

## 2022-12-11 DIAGNOSIS — R0602 Shortness of breath: Secondary | ICD-10-CM | POA: Diagnosis not present

## 2023-02-05 ENCOUNTER — Encounter: Payer: Self-pay | Admitting: Gastroenterology

## 2023-02-05 ENCOUNTER — Ambulatory Visit: Payer: BC Managed Care – PPO | Admitting: Gastroenterology

## 2023-02-05 VITALS — BP 108/78 | HR 75 | Ht 67.5 in | Wt 189.0 lb

## 2023-02-05 DIAGNOSIS — K5100 Ulcerative (chronic) pancolitis without complications: Secondary | ICD-10-CM

## 2023-02-05 MED ORDER — NA SULFATE-K SULFATE-MG SULF 17.5-3.13-1.6 GM/177ML PO SOLN: 1.0000 | Freq: Once | ORAL | 0 refills | Status: AC

## 2023-02-05 NOTE — Patient Instructions (Signed)
You have been scheduled for a colonoscopy. Please follow written instructions given to you at your visit today.  Please pick up your prep supplies at the pharmacy within the next 1-3 days. If you use inhalers (even only as needed), please bring them with you on the day of your procedure.   Due to recent changes in healthcare laws, you may see the results of your imaging and laboratory studies on MyChart before your provider has had a chance to review them.  We understand that in some cases there may be results that are confusing or concerning to you. Not all laboratory results come back in the same time frame and the provider may be waiting for multiple results in order to interpret others.  Please give [place] 48 hours in order for your provider to thoroughly review all the results before contacting the office for clarification of your results.    _______________________________________________________  If your blood pressure at your visit was 140/90 or greater, please contact your primary care physician to follow up on this.  _______________________________________________________  If you are age 72 or older, your body mass index should be between 23-30. Your Body mass index is 29.16 kg/m. If this is out of the aforementioned range listed, please consider follow up with your Primary Care Provider.  If you are age 73 or younger, your body mass index should be between 19-25. Your Body mass index is 29.16 kg/m. If this is out of the aformentioned range listed, please consider follow up with your Primary Care Provider.   ________________________________________________________  The [place] [facility] providers would like to encourage you to use [facility] to communicate with providers for non-urgent requests or questions.  Due to long hold times on the telephone, sending your provider a message by [facility] may be a faster and more efficient way to get a response.  Please allow 48 business hours for a response.   Please remember that this is for non-urgent requests.  _______________________________________________________   I appreciate the  opportunity to care for you  Thank You   Harl Bowie , MD

## 2023-02-05 NOTE — Progress Notes (Addendum)
[name]    [id number]    Jul 26, 1961  Primary Care Physician:Patient, No Pcp Per  Referring Physician: No referring provider defined for this encounter.   Chief complaint: Ulcerative colitis  HPI:  62 yr old very pleasant gentleman with history of pancolonic ulcerative colitis diagnosed in 1995 here for follow-up visit.   I last saw him in 12/2020 after an acute flare. He had run out of mesalamine at that time. He restarted using mesalamine and also Canasa suppositories 3 times a week with significant improvement of symptoms.  He is no longer having rectal discomfort, mucus or bleeding. Reviewed 2023 labs CBC, CMP, iron panel, ferritin, folate, B12 and CRP within normal limits. TB QuantiFERON gold negative  CRP 5.60 and ESR 30 are slightly elevated based on labs in November 2023  Today, he reports feeling great with his colitis well managed. He continues to take Mesalamine .375 g but is only taking this once most days.   Patients reports having normal BM and denies any constipation or diarrhea. Patient also reports having a good diet and is staying very active.   He denies any joint pain or arthritis but has occasional cramping with certain activities for which he drinks water with sea salt with relief.   Patient reports that his son was recently diagnosed with colitis.    Relevant GI Hx:  Lab Results  Component Value Date   IRON 52 10/21/2022   TIBC 278 10/21/2022   FERRITIN 98 10/21/2022   Lab Results  Component Value Date   WBC 6.7 10/21/2022   HGB 14.8 10/21/2022   HCT 44.2 10/21/2022   MCV 87.9 10/21/2022   PLT 267.0 10/21/2022    He was previously maintained on [name],  noted undetectable [name] drug level with elevated antibody >160 on labs on April 12, 2018.    Subsequently discontinued [name].   Colonoscopy September 18, 2017 showed altered vascular pattern, inactive colitis, 9 mm polyp removed from rectum with hot snare. Based on  pathology, biopsies from the left colon showed minimally active colitis.  Right colon biopsies showed normal mucosa.  The polyp was hyperplastic.  Recall colonoscopy in 2 years for surveillance    Current Outpatient Medications:    Cyanocobalamin 5000 MCG TBDP, Take 5,000 mcg by mouth daily., Disp: , Rfl:    mesalamine (APRISO) 0.375 g 24 hr capsule, Take 4 capsules (1.5 g total) by mouth daily., Disp: 120 capsule, Rfl: 3   mesalamine (CANASA) 1000 MG suppository, UNWRAP AND INSERT 1 SUPPOSITORY(1000 MG) RECTALLY AT BEDTIME AS NEEDED, Disp: 30 suppository, Rfl: 3   Na Sulfate-K Sulfate-Mg Sulf 17.5-3.13-1.6 GM/177ML SOLN, Take 1 kit by mouth once for 1 dose., Disp: 354 mL, Rfl: 0   Probiotic Product (PROBIOTIC-10) CAPS, Take by mouth daily., Disp: , Rfl:     Allergies as of 02/05/2023   (No Known Allergies)    Past Medical History:  Diagnosis Date   Adenomatous colon polyp    Arthritis    Iron deficiency anemia    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 12-14-14    Ulcerative colitis     Past Surgical History:  Procedure Laterality Date   COLONOSCOPY     fracture left ankle     KNEE ARTHROSCOPY     right   rotator cuff right shoulder      Family History  Problem Relation Age of Onset   Colitis Mother  Colon cancer Neg Hx    Colon polyps Neg Hx    Esophageal cancer Neg Hx    Stomach cancer Neg Hx    Pancreatic cancer Neg Hx     Social History   Socioeconomic History   Marital status: Married    Spouse name: Not on file   Number of children: 4   Years of education: Not on file   Highest education level: Not on file  Occupational History   Occupation: tree service  Tobacco Use   Smoking status: Former    Types: Cigarettes    Quit date: 04/22/1991    Years since quitting: 31.8   Smokeless tobacco: Never  Vaping Use   Vaping Use: Never used  Substance and Sexual Activity   Alcohol use: No   Drug use: No   Sexual activity: Not on file  Other  Topics Concern   Not on file  Social History Narrative   Not on file   Social Determinants of Health   Financial Resource Strain: Not on file  Food Insecurity: Not on file  Transportation Needs: Not on file  Physical Activity: Not on file  Stress: Not on file  Social Connections: Not on file  Intimate Partner Violence: Not on file      Review of systems: Review of Systems  Constitutional:  Negative for appetite change and fever.  HENT:  Negative for trouble swallowing.   Respiratory:  Negative for cough and shortness of breath.   Cardiovascular:  Negative for chest pain.  Gastrointestinal:  Negative for abdominal distention, abdominal pain, anal bleeding, blood in stool, constipation, diarrhea, nausea, rectal pain and vomiting.  Genitourinary:  Negative for dysuria.  Musculoskeletal:  Negative for back pain.  Skin:  Negative for rash.  Neurological:  Negative for weakness.  All other systems reviewed and are negative.    Physical Exam: Vitals:   02/05/23 1440  BP: 108/78  Pulse: 75  SpO2: 95%    Body mass index is 29.16 kg/m. General: well-appearing  Eyes: sclera anicteric, no redness ENT: oral mucosa moist without lesions, no cervical or supraclavicular lymphadenopathy CV: RRR, no JVD, no peripheral edema Resp: clear to auscultation bilaterally, normal RR and effort noted GI: soft, no tenderness, with active bowel sounds. No guarding or palpable organomegaly noted. Skin; warm and dry, no rash or jaundice noted Neuro: awake, alert and oriented x 3. Normal gross motor function and fluent speech   Data Reviewed:  Reviewed labs, radiology imaging, old records and pertinent past GI work up   Assessment and Plan/Recommendations:  62 year old pleasant gentleman with history of pancolonic ulcerative colitis initially diagnosed in 1995, was on [name] until 2019 which was discontinued due to significantly elevated antibodies with undetectable drug level  Continues  to remain in clinical remission Advised patient to start taking mesalamine (Apriso) as prescribed to prevent recurrent flare of ulcerative colitis, increase the dosage to 2 tabs 7050 mg per day  We will perform a colonoscopy for surveillance due to history of IBD/pancolitis >8 years and to assess disease activity, is scheduled on 03/11/2023   The risks and benefits as well as alternatives of colonoscopic/endoscopic procedure(s) have been discussed and reviewed. All questions answered. The patient agrees to proceed.    IBD health maintenance: Based on endoscopic findings, will reevaluate treatment plan, recheck CRP and ESR if needed. Borderline iron deficiency with low saturation, will recheck it in 6 months  Return in 6 months  I,Alexis Herring,acting as a Education administrator for [employer],  MD.,have documented all relevant documentation on the behalf of Harl Bowie, MD,as directed by  Harl Bowie, MD while in the presence of Harl Bowie, MD.   I, Harl Bowie, MD, have reviewed all documentation for this visit. The documentation on 02/05/23 for the exam, diagnosis, procedures, and orders are all accurate and complete.    This visit required 30 minutes of patient care (this includes precharting, chart review, review of results, face-to-face time used for counseling as well as treatment plan and follow-up. The patient was provided an opportunity to ask questions and all were answered. The patient agreed with the plan and demonstrated an understanding of the instructions.    Damaris Hippo , MD    CC: No ref. provider found

## 2023-03-03 ENCOUNTER — Encounter: Payer: Self-pay | Admitting: Gastroenterology

## 2023-03-11 ENCOUNTER — Encounter: Payer: Self-pay | Admitting: Gastroenterology

## 2023-03-11 ENCOUNTER — Ambulatory Visit (AMBULATORY_SURGERY_CENTER): Payer: BC Managed Care – PPO | Admitting: Gastroenterology

## 2023-03-11 VITALS — BP 110/69 | HR 61 | Temp 97.3°F | Resp 14 | Ht 67.0 in | Wt 189.0 lb

## 2023-03-11 DIAGNOSIS — K629 Disease of anus and rectum, unspecified: Secondary | ICD-10-CM | POA: Diagnosis not present

## 2023-03-11 DIAGNOSIS — D122 Benign neoplasm of ascending colon: Secondary | ICD-10-CM

## 2023-03-11 DIAGNOSIS — K5100 Ulcerative (chronic) pancolitis without complications: Secondary | ICD-10-CM

## 2023-03-11 DIAGNOSIS — K529 Noninfective gastroenteritis and colitis, unspecified: Secondary | ICD-10-CM | POA: Diagnosis not present

## 2023-03-11 DIAGNOSIS — D124 Benign neoplasm of descending colon: Secondary | ICD-10-CM | POA: Diagnosis not present

## 2023-03-11 MED ORDER — SODIUM CHLORIDE 0.9 % IV SOLN: 500.0000 mL | Freq: Once | INTRAVENOUS | Status: DC

## 2023-03-11 NOTE — Progress Notes (Signed)
Pt's states no medical or surgical changes since previsit or office visit. 

## 2023-03-11 NOTE — Patient Instructions (Addendum)
Resume previous diet Continue present medications Await pathology results Return to clinic in 6 months Repeat colonoscopy in 3 years Handout given for polyps and hemorrhoids  YOU HAD AN ENDOSCOPIC PROCEDURE TODAY AT [facility]:   Refer to the procedure report that was given to you for any specific questions about what was found during the examination.  If the procedure report does not answer your questions, please call your gastroenterologist to clarify.  If you requested that your care partner not be given the details of your procedure findings, then the procedure report has been included in a sealed envelope for you to review at your convenience later.  YOU SHOULD EXPECT: Some feelings of bloating in the abdomen. Passage of more gas than usual.  Walking can help get rid of the air that was put into your GI tract during the procedure and reduce the bloating. If you had a lower endoscopy (such as a colonoscopy or flexible sigmoidoscopy) you may notice spotting of blood in your stool or on the toilet paper. If you underwent a bowel prep for your procedure, you may not have a normal bowel movement for a few days.  Please Note:  You might notice some irritation and congestion in your nose or some drainage.  This is from the oxygen used during your procedure.  There is no need for concern and it should clear up in a day or so.  SYMPTOMS TO REPORT IMMEDIATELY:  Following lower endoscopy (colonoscopy):  Excessive amounts of blood in the stool  Significant tenderness or worsening of abdominal pains  Swelling of the abdomen that is new, acute  Fever of 100F or higher  For urgent or emergent issues, a gastroenterologist can be reached at any hour by calling [phone]. Do not use MyChart messaging for urgent concerns.    DIET:  We do recommend a small meal at first, but then you may proceed to your regular diet.  Drink plenty of fluids but you should avoid alcoholic beverages  for 24 hours.  ACTIVITY:  You should plan to take it easy for the rest of today and you should NOT DRIVE or use heavy machinery until tomorrow (because of the sedation medicines used during the test).    FOLLOW UP: Our staff will call the number listed on your records the next business day following your procedure.  We will call around 7:15- 8:00 am to check on you and address any questions or concerns that you may have regarding the information given to you following your procedure. If we do not reach you, we will leave a message.     If any biopsies were taken you will be contacted by phone or by letter within the next 1-3 weeks.  Please call us at [phone] if you have not heard about the biopsies in 3 weeks.   SIGNATURES/CONFIDENTIALITY: You and/or your care partner have signed paperwork which will be entered into your electronic medical record.  These signatures attest to the fact that that the information above on your After Visit Summary has been reviewed and is understood.  Full responsibility of the confidentiality of this discharge information lies with you and/or your care-partner.

## 2023-03-11 NOTE — Progress Notes (Unsigned)
Uneventful anesthetic. Report to pacu rn. Vss. Care resumed by rn. 

## 2023-03-11 NOTE — Op Note (Signed)
[facility] Patient Name: [name] Procedure Date: 03/11/2023 10:03 AM MRN: [id number] Endoscopist: Mauri Pole , MD, [id number] Age: 62 Referring MD:  Date of Birth: 04/14/1961 Gender: Male Account #: [id number] Procedure:                Colonoscopy Indications:              High risk colon cancer surveillance: Ulcerative                            pancolitis of 8 (or more) years duration Medicines:                Monitored Anesthesia Care Procedure:                Pre-Anesthesia Assessment:                           - Prior to the procedure, a History and Physical                            was performed, and patient medications and                            allergies were reviewed. The patient's tolerance of                            previous anesthesia was also reviewed. The risks                            and benefits of the procedure and the sedation                            options and risks were discussed with the patient.                            All questions were answered, and informed consent                            was obtained. Prior Anticoagulants: The patient has                            taken no anticoagulant or antiplatelet agents. ASA                            Grade Assessment: II - A patient with mild systemic                            disease. After reviewing the risks and benefits,                            the patient was deemed in satisfactory condition to                            undergo the procedure.  After obtaining informed consent, the colonoscope                            was passed under direct vision. Throughout the                            procedure, the patient's blood pressure, pulse, and                            oxygen saturations were monitored continuously. The                            Olympus PCF-[id number]) Colonoscope was                            introduced  through the anus and advanced to the the                            terminal ileum, with identification of the                            appendiceal orifice and IC valve. The colonoscopy                            was performed without difficulty. The patient                            tolerated the procedure well. The quality of the                            bowel preparation was good. The ileocecal valve,                            appendiceal orifice, and rectum were photographed. Scope In: 10:30:04 AM Scope Out: 10:42:57 AM Scope Withdrawal Time: 0 hours 10 minutes 52 seconds  Total Procedure Duration: 0 hours 12 minutes 53 seconds  Findings:                 The perianal and digital rectal examinations were                            normal.                           A 4 mm polyp was found in the ascending colon. The                            polyp was sessile. The polyp was removed with a                            cold snare. Resection and retrieval were complete.                           Mucosal changes were found in a continuous and  circumferential pattern from the anus to the                            hepatic flexure. This was graded as Mayo Score 1                            (mild, with erythema, decreased vascular pattern,                            mild friability and pseudo polyps), and when                            compared to the previous examination, the findings                            are quiescent. Biopsies were taken with a cold                            forceps for histology.                           Non-bleeding external and internal hemorrhoids were                            found during retroflexion. The hemorrhoids were                            medium-sized. Complications:            No immediate complications. Estimated Blood Loss:     Estimated blood loss was minimal. Impression:               - One 4 mm polyp in the  ascending colon, removed                            with a cold snare. Resected and retrieved.                           - Mild (Mayo Score 1) pancolitis ulcerative                            colitis, quiescent since the last examination.                            Biopsied.                           - Non-bleeding external and internal hemorrhoids. Recommendation:           - Patient has a contact number available for                            emergencies. The signs and symptoms of potential                            delayed complications were discussed with  the                            patient. Return to normal activities tomorrow.                            Written discharge instructions were provided to the                            patient.                           - Resume previous diet.                           - Continue present medications.                           - Await pathology results.                           - Repeat colonoscopy in 3 years for surveillance.                           - Return to GI clinic in 6 months. Mauri Pole, MD 03/11/2023 10:52:26 AM This report has been signed electronically.

## 2023-03-11 NOTE — Progress Notes (Unsigned)
Called to room to assist during endoscopic procedure.  Patient ID and intended procedure confirmed with present staff. Received instructions for my participation in the procedure from the performing physician.  

## 2023-03-11 NOTE — Progress Notes (Unsigned)
[facility] History and Physical   Primary Care Physician:  Patient, No Pcp Per   Reason for Procedure:  Ulcerative colitis  Plan:    Colonoscopy with possible interventions as needed     HPI: [name] is a very pleasant 62 y.o. male here for surveillance colonoscopy with h/o ulcerative pan colitis >10 years.   The risks and benefits as well as alternatives of endoscopic procedure(s) have been discussed and reviewed. All questions answered. The patient agrees to proceed.    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 12-14-14    Ulcerative colitis     Past Surgical History:  Procedure Laterality Date   COLONOSCOPY     fracture left ankle     KNEE ARTHROSCOPY     right   rotator cuff right shoulder      Prior to Admission medications   Medication Sig Start Date End Date Taking? Authorizing Provider  Cyanocobalamin 5000 MCG TBDP Take 5,000 mcg by mouth daily.   Yes [provider]  mesalamine (APRISO) 0.375 g 24 hr capsule Take 4 capsules (1.5 g total) by mouth daily. 10/03/22  Yes Mauri Pole, MD  Probiotic Product (PROBIOTIC-10) CAPS Take by mouth daily.   Yes [provider]  mesalamine (CANASA) 1000 MG suppository UNWRAP AND INSERT 1 SUPPOSITORY(1000 MG) RECTALLY AT BEDTIME AS NEEDED 04/14/22   Mauri Pole, MD    Current Outpatient Medications  Medication Sig Dispense Refill   Cyanocobalamin 5000 MCG TBDP Take 5,000 mcg by mouth daily.     mesalamine (APRISO) 0.375 g 24 hr capsule Take 4 capsules (1.5 g total) by mouth daily. 120 capsule 3   Probiotic Product (PROBIOTIC-10) CAPS Take by mouth daily.     mesalamine (CANASA) 1000 MG suppository UNWRAP AND INSERT 1 SUPPOSITORY(1000 MG) RECTALLY AT BEDTIME AS NEEDED 30 suppository 3   Current Facility-Administered Medications  Medication Dose Route  Frequency Provider Last Rate Last Admin   0.9 %  sodium chloride infusion  500 mL Intravenous Once Mauri Pole, MD        Allergies as of 03/11/2023   (No Known Allergies)    Family History  Problem Relation Age of Onset   Colitis Mother    Colon cancer Neg Hx    Colon polyps Neg Hx    Esophageal cancer Neg Hx    Stomach cancer Neg Hx    Pancreatic cancer Neg Hx     Social History   Socioeconomic History   Marital status: Married    Spouse name: Not on file   Number of children: 4   Years of education: Not on file   Highest education level: Not on file  Occupational History   Occupation: tree service  Tobacco Use   Smoking status: Former    Types: Cigarettes    Quit date: 04/22/1991    Years since quitting: 31.9   Smokeless tobacco: Never  Vaping Use   Vaping Use: Never used  Substance and Sexual Activity   Alcohol use: No   Drug use: No   Sexual activity: Not on file  Other Topics Concern   Not on file  Social History Narrative   Not on file   Social Determinants of Health   Financial Resource Strain: Not on file  Food Insecurity: Not on file  Transportation Needs:  Not on file  Physical Activity: Not on file  Stress: Not on file  Social Connections: Not on file  Intimate Partner Violence: Not on file    Review of Systems:  All other review of systems negative except as mentioned in the HPI.  Physical Exam: Vital signs in last 24 hours: Blood Pressure 110/83   Pulse 73   Temperature (Abnormal) 97.3 F (36.3 C)   Respiration 14   Height 5\' 7"  (1.702 m)   Weight 189 lb (85.7 kg)   Oxygen Saturation 98%   Body Mass Index 29.60 kg/m  General:   Alert, NAD Lungs:  Clear .   Heart:  Regular rate and rhythm Abdomen:  Soft, nontender and nondistended. Neuro/Psych:  Alert and cooperative. Normal mood and affect. A and O x 3  Reviewed labs, radiology imaging, old records and pertinent past GI work up  Patient is appropriate for planned  procedure(s) and anesthesia in an ambulatory setting   K. Denzil Magnuson , MD [phone]

## 2023-03-12 ENCOUNTER — Telehealth: Payer: Self-pay

## 2023-03-12 NOTE — Telephone Encounter (Signed)
Follow up call to pt, no answer.  

## 2023-03-31 DIAGNOSIS — N3000 Acute cystitis without hematuria: Secondary | ICD-10-CM | POA: Diagnosis not present

## 2023-05-15 ENCOUNTER — Other Ambulatory Visit: Payer: Self-pay | Admitting: Gastroenterology

## 2023-05-29 ENCOUNTER — Telehealth: Payer: Self-pay

## 2023-05-29 NOTE — Telephone Encounter (Signed)
Called patient to discuss referring the [facility] or [facility]. Patient states "I have Googled this and thought about it. I do not want to go at this time." "I am not going to gather myself up for research and have to keep going back again and again." "I told her I did not want to do that." He asked if the biopsies were benign or not. I explained the biopsies showed mild dysplasia. Patient states "Yes, I know what that is." Again states refusal for the referral.

## 2023-05-29 NOTE — Telephone Encounter (Signed)
-----   Message from Napoleon Form, MD sent at 03/26/2023 10:48 AM EDT ----- Discussed results with [name] that one of the random biopsies taken for surveillance in the left colon is positive for low-grade dysplasia. He will need repeat colonoscopy soon with chromoendoscopy to better evaluate the mucosa and obtain additional colon biopsies, to decrease the risk for progression to colon cancer.  If he has multiple additional areas with dysplasia, may also need to consider possible colectomy.  He would like to think through this and will call us back within a week. We will need to send referral to either [facility] or [facility] for chromoendoscopy based on his preference

## 2023-06-02 NOTE — Telephone Encounter (Signed)
Please schedule office visit with me to discuss this further.  Do not recommend ignoring dysplasia on surveillance biopsies, it is precancerous condition, potential to progress to cancer if no further action is taken

## 2023-06-04 ENCOUNTER — Other Ambulatory Visit: Payer: Self-pay

## 2023-06-04 NOTE — Telephone Encounter (Signed)
Letter to the patient with an appointment date in October.

## 2023-06-24 ENCOUNTER — Telehealth: Payer: Self-pay | Admitting: Gastroenterology

## 2023-06-24 NOTE — Telephone Encounter (Addendum)
Called [name] to discuss follow-up for low-grade dysplasia on random colon biopsies during surveillance colonoscopy on March 11, 2023.  He has longstanding history of pan ulcerative colitis.  He was advised to follow-up in IBD center at [facility] for low-grade dysplasia multiple times in the past few months  He continues to defer follow-up exam, he does not feel the urgency or the need to have follow-up colonoscopy with chrome endoscopy/methylene blue to identify the area of low-grade dysplasia to remove the lesion or obtain additional random colon biopsies as needed. He would rather prefer to wait and see, he is not worried if it were to progress to colon cancer, " I looked up and saw that colon cancer can be easily treated, part of colon can be removed and joined back together" I do not want to be a [place] pig and do not want to go to [facility].  I explained to him that we do not have methylene blue available at our endoscopy center in order to perform chromoendoscopy and he will benefit from following up at IBD center.  He does not agree with the plan and does not wish to follow follow-up or proceed with chromoendoscopy at this point.

## 2023-06-25 ENCOUNTER — Other Ambulatory Visit: Payer: Self-pay

## 2023-07-01 ENCOUNTER — Telehealth: Payer: Self-pay | Admitting: Gastroenterology

## 2023-07-01 NOTE — Telephone Encounter (Signed)
Patient dismissed from [facility] and all providers practicing at this clinic. We believe our patient / provider relationship has been compromised and this would request that your seek care elsewhere 07/11 24

## 2023-08-23 ENCOUNTER — Emergency Department (HOSPITAL_COMMUNITY): Payer: BC Managed Care – PPO

## 2023-08-23 ENCOUNTER — Encounter (HOSPITAL_COMMUNITY): Payer: Self-pay

## 2023-08-23 ENCOUNTER — Emergency Department (HOSPITAL_COMMUNITY)
Admission: EM | Admit: 2023-08-23 | Discharge: 2023-08-23 | Disposition: A | Discharge status: Home / Self Care | Payer: BC Managed Care – PPO | Attending: Emergency Medicine | Admitting: Emergency Medicine

## 2023-08-23 ENCOUNTER — Other Ambulatory Visit: Payer: Self-pay

## 2023-08-23 DIAGNOSIS — R102 Pelvic and perineal pain: Secondary | ICD-10-CM | POA: Diagnosis not present

## 2023-08-23 DIAGNOSIS — N400 Enlarged prostate without lower urinary tract symptoms: Secondary | ICD-10-CM | POA: Diagnosis not present

## 2023-08-23 LAB — URINALYSIS, ROUTINE W REFLEX MICROSCOPIC
Bilirubin Urine: NEGATIVE
Glucose, UA: NEGATIVE mg/dL
Hgb urine dipstick: NEGATIVE
Ketones, ur: NEGATIVE mg/dL
Leukocytes,Ua: NEGATIVE
Nitrite: NEGATIVE
Protein, ur: NEGATIVE mg/dL
Specific Gravity, Urine: 1.019 (ref 1.005–1.030)
pH: 5 (ref 5.0–8.0)

## 2023-08-23 LAB — CBC
HCT: 48 % (ref 39.0–52.0)
Hemoglobin: 15.7 g/dL (ref 13.0–17.0)
MCH: 29.5 pg (ref 26.0–34.0)
MCHC: 32.7 g/dL (ref 30.0–36.0)
MCV: 90.1 fL (ref 80.0–100.0)
Platelets: 340 10*3/uL (ref 150–400)
RBC: 5.33 MIL/uL (ref 4.22–5.81)
RDW: 13.8 % (ref 11.5–15.5)
WBC: 8.7 10*3/uL (ref 4.0–10.5)
nRBC: 0 % (ref 0.0–0.2)

## 2023-08-23 LAB — COMPREHENSIVE METABOLIC PANEL
ALT: 16 U/L (ref 0–44)
AST: 15 U/L (ref 15–41)
Albumin: 3.6 g/dL (ref 3.5–5.0)
Alkaline Phosphatase: 89 U/L (ref 38–126)
Anion gap: 12 (ref 5–15)
BUN: 19 mg/dL (ref 6–20)
CO2: 24 mmol/L (ref 22–32)
Calcium: 9.4 mg/dL (ref 8.9–10.3)
Chloride: 101 mmol/L (ref 98–111)
Creatinine, Ser: 0.88 mg/dL (ref 0.61–1.24)
GFR, Estimated: >60 mL/min (ref >60)
Glucose, Bld: 104 mg/dL — ABNORMAL HIGH (ref 70–99)
Potassium: 4.1 mmol/L (ref 3.5–5.1)
Sodium: 137 mmol/L (ref 135–145)
Total Bilirubin: 0.4 mg/dL (ref 0.3–1.2)
Total Protein: 7.2 g/dL (ref 6.5–8.1)

## 2023-08-23 LAB — LIPASE, BLOOD: Lipase: 44 U/L (ref 11–51)

## 2023-08-23 MED ORDER — IOHEXOL 350 MG/ML SOLN
75.0000 mL | Freq: Once | INTRAVENOUS | Status: AC | PRN
  Administered 2023-08-23: 75 mL via INTRAVENOUS

## 2023-08-23 MED ORDER — IBUPROFEN 600 MG PO TABS
600.0000 mg | ORAL_TABLET | Freq: Four times a day (QID) | ORAL | 0 refills | Status: AC | PRN
Start: 2023-08-23 — End: ?

## 2023-08-23 NOTE — Discharge Instructions (Addendum)
No serious condition or infection was identified on your CT scan or lab values. It is recommended that you see GI for maintenance of your UC and to review the CT scan done today.   Ibuprofen prescription refilled and sent to your pharmacy.   For primary care follow up, you can call [facility] as referred or check with [facility] website for a list of providers that accept that insurance.   For GI follow up, please refer to office listings provided.

## 2023-08-23 NOTE — ED Triage Notes (Signed)
Pt reports pelvic pain for the past year almost but recently was treated for a boil on his left buttock near his rectum and states there is still an area that he tries popping in the shower. States "it feels like it is deeper"  Has been seen at [facility] and a urologist for the same with negative workups. Urologist told him it might be related to his pelvic floor.

## 2023-08-23 NOTE — ED Provider Notes (Signed)
[place] EMERGENCY DEPARTMENT AT [facility] Provider Note   CSN: [id number] Arrival date & time: 08/23/23  1032     History  Chief Complaint  Patient presents with   Pelvic Pain    [name] is a 62 y.o. male.  Patient to ED with progressively worsening pelvic pain for the past 8 months. History of UC, well controlled. 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 Also reports history of abscess to the right buttock that was previously treated with antibiotics, got better and is now sore but not red or draining. No fever. He states the pain feels it goes across the bottom of his pelvis. No scrotal swelling or testicular pain. No urinary symptoms. He was evaluated by urology and reports the work up there was negative.   The history is provided by the patient. No language interpreter was used.  Pelvic Pain       Home Medications Prior to Admission medications   Medication Sig Start Date End Date Taking? Authorizing Provider  ibuprofen (ADVIL) 600 MG tablet Take 1 tablet (600 mg total) by mouth every 6 (six) hours as needed. 08/23/23  Yes Letonya Mangels, Melvenia Beam, PA-C  Cyanocobalamin 5000 MCG TBDP Take 5,000 mcg by mouth daily.    [provider]  mesalamine (APRISO) 0.375 g 24 hr capsule Take 4 capsules (1.5 g total) by mouth daily. 10/03/22   Napoleon Form, MD  mesalamine (CANASA) 1000 MG suppository UNWRAP AND INSERT 1 SUPPOSITORY(1000 MG) RECTALLY AT BEDTIME AS NEEDED 05/15/23   Napoleon Form, MD  Probiotic Product (PROBIOTIC-10) CAPS Take by mouth daily.    [provider]      Allergies    Patient has no known allergies.    Review of Systems   Review of Systems  Genitourinary:  Positive for pelvic pain.    Physical Exam Updated Vital Signs BP (!) 134/97   Pulse 66   Temp 97.8 F (36.6 C) (Oral)   Resp 18   SpO2 97%  Physical Exam Vitals and nursing note reviewed.  Constitutional:      Appearance: Normal  appearance. He is not ill-appearing.  Cardiovascular:     Rate and Rhythm: Normal rate.  Pulmonary:     Effort: Pulmonary effort is normal.  Abdominal:     Palpations: Abdomen is soft.     Tenderness: There is no abdominal tenderness.  Genitourinary:    Comments: Small, firm, mildly tender nodule lateral to rectum on left buttock. No redness or fluctuance. No scrotal tenderness or redness.  Musculoskeletal:        General: Normal range of motion.     Cervical back: Normal range of motion.  Skin:    General: Skin is warm and dry.     Findings: No erythema.  Neurological:     Mental Status: He is alert and oriented to person, place, and time.     ED Results / Procedures / Treatments   Labs (all labs ordered are listed, but only abnormal results are displayed) Labs Reviewed  COMPREHENSIVE METABOLIC PANEL - Abnormal; Notable for the following components:      Result Value   Glucose, Bld 104 (*)    All other components within normal limits  LIPASE, BLOOD  CBC  URINALYSIS, ROUTINE W REFLEX MICROSCOPIC    EKG None  Radiology No results found.  Procedures Procedures    Medications Ordered in ED Medications  iohexol (OMNIPAQUE) 350 MG/ML injection 75 mL (75 mLs  Intravenous Contrast Given 08/23/23 1253)    ED Course/ Medical Decision Making/ A&P Clinical Course as of 09/01/23 1422  Sun Aug 23, 2023  1404 CT pelvis w/CM showing mild prostatitis. Urine clear. Doubt source of discomfort. There is a perianal sinus tract or fistula without stranding or fluid collection. No open wound or obvious infection to physical exam of buttock. Recommend follow up with GI. No definitive cause found for patient's symptoms but no serious condition or infection identified. He is felt appropriate for discharge home. Will refill ibuprofen Rx per patient request.. [SU]    Clinical Course User Index [SU] Elpidio Anis, PA-C                                 Medical Decision Making Amount  and/or Complexity of Data Reviewed Labs: ordered. Radiology: ordered.  Risk Prescription drug management.           Final Clinical Impression(s) / ED Diagnoses Final diagnoses:  Pelvic pain    Rx / DC Orders ED Discharge Orders          Ordered    ibuprofen (ADVIL) 600 MG tablet  Every 6 hours PRN        08/23/23 1411              Elpidio Anis, PA-C 09/01/23 1422    Laurence Spates, MD 09/03/23 [phone]

## 2023-09-02 ENCOUNTER — Telehealth: Payer: Self-pay | Admitting: Orthopedic Surgery

## 2023-09-02 ENCOUNTER — Other Ambulatory Visit: Payer: Self-pay

## 2023-09-02 DIAGNOSIS — K51019 Ulcerative (chronic) pancolitis with unspecified complications: Secondary | ICD-10-CM

## 2023-09-02 NOTE — Telephone Encounter (Signed)
I called and sw pt after speaking with Dr. Lajoyce Corners. He said the pt should follow up with PCP not an ortho issue. The pt states he does not have one and needs referral this was placed today.

## 2023-09-02 NOTE — Telephone Encounter (Signed)
Pt came in stating he went to the ED and they did a Pelvic CT and he spoke with Dr. Lajoyce Corners after that and stated he would need a X ray before he could examine him, pt would like to have an x ray Friday and would like Dr Lajoyce Corners to look over both images and advise him on treatment

## 2023-09-11 DIAGNOSIS — K51813 Other ulcerative colitis with fistula: Secondary | ICD-10-CM | POA: Diagnosis not present

## 2023-09-11 DIAGNOSIS — Z8719 Personal history of other diseases of the digestive system: Secondary | ICD-10-CM | POA: Diagnosis not present

## 2023-09-11 DIAGNOSIS — R102 Pelvic and perineal pain: Secondary | ICD-10-CM | POA: Diagnosis not present

## 2023-09-11 DIAGNOSIS — K603 Anal fistula: Secondary | ICD-10-CM | POA: Diagnosis not present

## 2023-10-06 ENCOUNTER — Ambulatory Visit: Payer: BC Managed Care – PPO | Admitting: Gastroenterology

## 2023-11-03 ENCOUNTER — Other Ambulatory Visit: Payer: Self-pay | Admitting: Gastroenterology

## 2024-01-27 DIAGNOSIS — Z419 Encounter for procedure for purposes other than remedying health state, unspecified: Secondary | ICD-10-CM | POA: Diagnosis not present

## 2024-02-13 DIAGNOSIS — Z419 Encounter for procedure for purposes other than remedying health state, unspecified: Secondary | ICD-10-CM | POA: Diagnosis not present

## 2024-03-26 DIAGNOSIS — Z419 Encounter for procedure for purposes other than remedying health state, unspecified: Secondary | ICD-10-CM | POA: Diagnosis not present

## 2024-04-25 DIAGNOSIS — Z419 Encounter for procedure for purposes other than remedying health state, unspecified: Secondary | ICD-10-CM | POA: Diagnosis not present

## 2024-05-26 DIAGNOSIS — Z419 Encounter for procedure for purposes other than remedying health state, unspecified: Secondary | ICD-10-CM | POA: Diagnosis not present

## 2024-06-25 DIAGNOSIS — Z419 Encounter for procedure for purposes other than remedying health state, unspecified: Secondary | ICD-10-CM | POA: Diagnosis not present
